# Patient Record
Sex: Female | Born: 1955
Health system: Southern US, Community
[De-identification: ages and names within clinical notes are randomized; demographics above are authoritative.]

## PROBLEM LIST (undated history)

## (undated) DIAGNOSIS — N946 Dysmenorrhea, unspecified: Secondary | ICD-10-CM

## (undated) DIAGNOSIS — N870 Mild cervical dysplasia: Secondary | ICD-10-CM

## (undated) DIAGNOSIS — F419 Anxiety disorder, unspecified: Secondary | ICD-10-CM

## (undated) DIAGNOSIS — G4700 Insomnia, unspecified: Secondary | ICD-10-CM

## (undated) DIAGNOSIS — E785 Hyperlipidemia, unspecified: Secondary | ICD-10-CM

## (undated) DIAGNOSIS — D219 Benign neoplasm of connective and other soft tissue, unspecified: Secondary | ICD-10-CM

## (undated) DIAGNOSIS — Z5189 Encounter for other specified aftercare: Secondary | ICD-10-CM

## (undated) DIAGNOSIS — E7800 Pure hypercholesterolemia, unspecified: Secondary | ICD-10-CM

## (undated) DIAGNOSIS — H269 Unspecified cataract: Secondary | ICD-10-CM

## (undated) HISTORY — DX: Dysmenorrhea, unspecified: N94.6

## (undated) HISTORY — PX: COLPOSCOPY: SHX161

## (undated) HISTORY — DX: Pure hypercholesterolemia, unspecified: E78.00

## (undated) HISTORY — PX: TOE SURGERY: SHX1073

## (undated) HISTORY — DX: Mild cervical dysplasia: N87.0

## (undated) HISTORY — PX: OTHER SURGICAL HISTORY: SHX169

## (undated) HISTORY — DX: Encounter for other specified aftercare: Z51.89

## (undated) HISTORY — PX: GYNECOLOGIC CRYOSURGERY: SHX857

## (undated) HISTORY — DX: Benign neoplasm of connective and other soft tissue, unspecified: D21.9

## (undated) HISTORY — DX: Hyperlipidemia, unspecified: E78.5

## (undated) HISTORY — DX: Anxiety disorder, unspecified: F41.9

## (undated) HISTORY — DX: Insomnia, unspecified: G47.00

## (undated) HISTORY — DX: Unspecified cataract: H26.9

---

## 1997-07-06 HISTORY — PX: ABDOMINAL HYSTERECTOMY: SHX81

## 1999-10-22 ENCOUNTER — Other Ambulatory Visit: Admission: RE | Admit: 1999-10-22 | Discharge: 1999-10-22 | Payer: Self-pay | Admitting: Obstetrics and Gynecology

## 2000-12-02 ENCOUNTER — Encounter: Payer: Self-pay | Admitting: Obstetrics and Gynecology

## 2000-12-02 ENCOUNTER — Ambulatory Visit (HOSPITAL_COMMUNITY): Admission: RE | Admit: 2000-12-02 | Discharge: 2000-12-02 | Payer: Self-pay | Admitting: Obstetrics and Gynecology

## 2001-10-27 ENCOUNTER — Other Ambulatory Visit: Admission: RE | Admit: 2001-10-27 | Discharge: 2001-10-27 | Payer: Self-pay | Admitting: Obstetrics and Gynecology

## 2001-12-06 ENCOUNTER — Ambulatory Visit (HOSPITAL_COMMUNITY): Admission: RE | Admit: 2001-12-06 | Discharge: 2001-12-06 | Payer: Self-pay | Admitting: Obstetrics and Gynecology

## 2001-12-06 ENCOUNTER — Encounter: Payer: Self-pay | Admitting: Obstetrics and Gynecology

## 2002-10-31 ENCOUNTER — Other Ambulatory Visit: Admission: RE | Admit: 2002-10-31 | Discharge: 2002-10-31 | Payer: Self-pay | Admitting: Obstetrics and Gynecology

## 2002-12-08 ENCOUNTER — Encounter: Payer: Self-pay | Admitting: Obstetrics and Gynecology

## 2002-12-08 ENCOUNTER — Ambulatory Visit (HOSPITAL_COMMUNITY): Admission: RE | Admit: 2002-12-08 | Discharge: 2002-12-08 | Payer: Self-pay | Admitting: Obstetrics and Gynecology

## 2003-09-12 ENCOUNTER — Ambulatory Visit (HOSPITAL_COMMUNITY): Admission: RE | Admit: 2003-09-12 | Discharge: 2003-09-12 | Payer: Self-pay | Admitting: Gastroenterology

## 2003-09-12 ENCOUNTER — Encounter (INDEPENDENT_AMBULATORY_CARE_PROVIDER_SITE_OTHER): Payer: Self-pay | Admitting: Specialist

## 2003-11-01 ENCOUNTER — Other Ambulatory Visit: Admission: RE | Admit: 2003-11-01 | Discharge: 2003-11-01 | Payer: Self-pay | Admitting: Obstetrics and Gynecology

## 2003-12-18 ENCOUNTER — Ambulatory Visit (HOSPITAL_COMMUNITY): Admission: RE | Admit: 2003-12-18 | Discharge: 2003-12-18 | Payer: Self-pay | Admitting: Obstetrics and Gynecology

## 2004-11-06 ENCOUNTER — Other Ambulatory Visit: Admission: RE | Admit: 2004-11-06 | Discharge: 2004-11-06 | Payer: Self-pay | Admitting: Obstetrics and Gynecology

## 2005-01-20 ENCOUNTER — Ambulatory Visit (HOSPITAL_COMMUNITY): Admission: RE | Admit: 2005-01-20 | Discharge: 2005-01-20 | Payer: Self-pay | Admitting: Obstetrics and Gynecology

## 2005-11-06 ENCOUNTER — Other Ambulatory Visit: Admission: RE | Admit: 2005-11-06 | Discharge: 2005-11-06 | Payer: Self-pay | Admitting: Obstetrics and Gynecology

## 2006-01-27 ENCOUNTER — Ambulatory Visit (HOSPITAL_COMMUNITY): Admission: RE | Admit: 2006-01-27 | Discharge: 2006-01-27 | Payer: Self-pay | Admitting: Obstetrics and Gynecology

## 2007-02-01 ENCOUNTER — Ambulatory Visit (HOSPITAL_COMMUNITY): Admission: RE | Admit: 2007-02-01 | Discharge: 2007-02-01 | Payer: Self-pay | Admitting: Obstetrics and Gynecology

## 2007-02-02 ENCOUNTER — Other Ambulatory Visit: Admission: RE | Admit: 2007-02-02 | Discharge: 2007-02-02 | Payer: Self-pay | Admitting: Gynecology

## 2008-04-10 ENCOUNTER — Encounter: Payer: Self-pay | Admitting: Obstetrics and Gynecology

## 2008-04-10 ENCOUNTER — Other Ambulatory Visit: Admission: RE | Admit: 2008-04-10 | Discharge: 2008-04-10 | Payer: Self-pay | Admitting: Obstetrics and Gynecology

## 2008-04-10 ENCOUNTER — Ambulatory Visit: Payer: Self-pay | Admitting: Obstetrics and Gynecology

## 2008-04-10 ENCOUNTER — Ambulatory Visit (HOSPITAL_COMMUNITY): Admission: RE | Admit: 2008-04-10 | Discharge: 2008-04-10 | Payer: Self-pay | Admitting: Obstetrics and Gynecology

## 2008-04-18 ENCOUNTER — Encounter: Admission: RE | Admit: 2008-04-18 | Discharge: 2008-04-18 | Payer: Self-pay | Admitting: Obstetrics and Gynecology

## 2009-04-11 ENCOUNTER — Encounter: Admission: RE | Admit: 2009-04-11 | Discharge: 2009-04-11 | Payer: Self-pay | Admitting: Obstetrics and Gynecology

## 2009-04-15 ENCOUNTER — Encounter: Payer: Self-pay | Admitting: Obstetrics and Gynecology

## 2009-04-15 ENCOUNTER — Other Ambulatory Visit: Admission: RE | Admit: 2009-04-15 | Discharge: 2009-04-15 | Payer: Self-pay | Admitting: Obstetrics and Gynecology

## 2009-04-15 ENCOUNTER — Ambulatory Visit: Payer: Self-pay | Admitting: Obstetrics and Gynecology

## 2010-03-13 ENCOUNTER — Encounter: Admission: RE | Admit: 2010-03-13 | Discharge: 2010-03-13 | Payer: Self-pay | Admitting: Otolaryngology

## 2010-04-16 ENCOUNTER — Other Ambulatory Visit: Admission: RE | Admit: 2010-04-16 | Discharge: 2010-04-16 | Payer: Self-pay | Admitting: Obstetrics and Gynecology

## 2010-04-16 ENCOUNTER — Ambulatory Visit: Payer: Self-pay | Admitting: Obstetrics and Gynecology

## 2010-04-30 ENCOUNTER — Encounter: Admission: RE | Admit: 2010-04-30 | Discharge: 2010-04-30 | Payer: Self-pay | Admitting: Obstetrics and Gynecology

## 2010-05-07 ENCOUNTER — Ambulatory Visit: Payer: Self-pay | Admitting: Obstetrics and Gynecology

## 2010-06-06 ENCOUNTER — Ambulatory Visit (HOSPITAL_BASED_OUTPATIENT_CLINIC_OR_DEPARTMENT_OTHER)
Admission: RE | Admit: 2010-06-06 | Discharge: 2010-06-06 | Payer: Self-pay | Source: Home / Self Care | Admitting: Orthopedic Surgery

## 2010-06-24 ENCOUNTER — Ambulatory Visit: Payer: Self-pay | Admitting: Obstetrics and Gynecology

## 2010-09-16 LAB — POCT HEMOGLOBIN-HEMACUE: Hemoglobin: 14.4 g/dL (ref 12.0–15.0)

## 2010-11-21 NOTE — Op Note (Signed)
NAME:  April Wood, April Wood                            ACCOUNT NO.:  [id number]   MEDICAL RECORD NO.:  [id number]                   PATIENT TYPE:  AMB   LOCATION:  ENDO                                 FACILITY:  [facility]   PHYSICIAN:  Bernette Redbird, M.D.                DATE OF BIRTH:  16-Dec-1955   DATE OF PROCEDURE:  09/12/2003  DATE OF DISCHARGE:                                 OPERATIVE REPORT   PROCEDURE:  Colonoscopy with biopsy.   INDICATIONS FOR PROCEDURE:  Screening for colon cancer in a patient with a  family history of colon cancer in a maternal aunt and colon polyps in a  maternal uncle, without worrisome symptoms.   FINDINGS:  Diminutive polyp near the hepatic flexure.  Right side  diverticulosis.   DESCRIPTION OF PROCEDURE:  The nature, purpose and risk of the procedure had  been discussed with the patient who provided written consent.  Sedation was  fentanyl 87.5 mcg and Versed 7 mg IV without arrhythmias or desaturation.   The Olympus adjustable tension pediatric video colonoscope was advanced  around the colon.  I encountered a 2 mm sessile polyp near the hepatic  flexure in the transverse colon, removed by several cold biopsies to the  point of complete excision. The scope was then advanced to the cecum as  identified by clear visualization of the appendiceal orifice and pullback  was initiated. The quality of the prep was excellent. It is felt that all  areas were well seen.   There was some mild right sided diverticulosis.   No other polyps were seen other than the one mentioned above, no large  polyps, no cancer, colitis, vascular malformations or extensive  diverticulosis.   Retroflexion in the rectum was unremarkable.   The patient tolerated the procedure well and there were no apparent  complications.   IMPRESSION:  1. Solitary diminutive sessile polyp removed from the colon as described     above (211.3).  2. Mild right side diverticulosis.  3. Remote family  history of colon cancer.   PLAN:  Await pathology results with anticipated probable colonoscopic  followup in five years in view of the family history.                                               Bernette Redbird, M.D.    RB/MEDQ  D:  09/12/2003  T:  09/12/2003  Job:  [id number]   cc:   Marjory Lies, M.D.  [address]  [place]  [place] [address]  Fax: [phone]

## 2011-05-04 ENCOUNTER — Other Ambulatory Visit: Payer: Self-pay | Admitting: Obstetrics and Gynecology

## 2011-05-04 DIAGNOSIS — Z1231 Encounter for screening mammogram for malignant neoplasm of breast: Secondary | ICD-10-CM

## 2011-05-27 ENCOUNTER — Encounter: Payer: Self-pay | Admitting: Gynecology

## 2011-05-27 DIAGNOSIS — N946 Dysmenorrhea, unspecified: Secondary | ICD-10-CM | POA: Insufficient documentation

## 2011-05-27 DIAGNOSIS — D219 Benign neoplasm of connective and other soft tissue, unspecified: Secondary | ICD-10-CM | POA: Insufficient documentation

## 2011-06-08 ENCOUNTER — Other Ambulatory Visit (HOSPITAL_COMMUNITY)
Admission: RE | Admit: 2011-06-08 | Discharge: 2011-06-08 | Disposition: A | Discharge status: Home / Self Care | Payer: BC Managed Care – PPO | Source: Ambulatory Visit | Attending: Obstetrics and Gynecology | Admitting: Obstetrics and Gynecology

## 2011-06-08 ENCOUNTER — Encounter: Payer: Self-pay | Admitting: Obstetrics and Gynecology

## 2011-06-08 ENCOUNTER — Ambulatory Visit (INDEPENDENT_AMBULATORY_CARE_PROVIDER_SITE_OTHER): Payer: BC Managed Care – PPO | Admitting: Obstetrics and Gynecology

## 2011-06-08 VITALS — BP 132/76 | Ht 63.0 in | Wt 149.0 lb

## 2011-06-08 DIAGNOSIS — R823 Hemoglobinuria: Secondary | ICD-10-CM

## 2011-06-08 DIAGNOSIS — Z01419 Encounter for gynecological examination (general) (routine) without abnormal findings: Secondary | ICD-10-CM

## 2011-06-08 DIAGNOSIS — N870 Mild cervical dysplasia: Secondary | ICD-10-CM | POA: Insufficient documentation

## 2011-06-08 MED ORDER — ESTRADIOL ACETATE 0.05 MG/24HR VA RING: VAGINAL_RING | VAGINAL | Status: DC

## 2011-06-08 NOTE — Progress Notes (Signed)
Patient came to see me today for an annual GYN exam. She remains on Femring which is very happy with. She is having no vaginal bleeding or pelvic pain. She is due for her mammogram and has made an appointment. She does her lab through her PCP. She said to bone densities which were basically normal.  HEENT: Within normal limits.   Kennon Portela present Neck: No masses. Supraclavicular lymph nodes: Not enlarged. Breasts: Examined in both sitting and lying position. Symmetrical without skin changes or masses. Abdomen: Soft no masses guarding or rebound. No hernias. Pelvic: External within normal limits. BUS within normal limits. Vaginal examination shows good estrogen effect, no cystocele enterocele or rectocele. Cervix and uterus absent. Adnexa within normal limits. Rectovaginal confirmatory. Extremities within normal limits.  Assessment: Menopausal symptoms  Plan: Continue Femring. Mammogram.

## 2011-06-08 NOTE — Progress Notes (Signed)
Addended byCammie Mcgee T on: 06/08/2011 02:31 PM   Modules accepted: Orders

## 2011-06-10 ENCOUNTER — Ambulatory Visit
Admission: RE | Admit: 2011-06-10 | Discharge: 2011-06-10 | Disposition: A | Discharge status: Home / Self Care | Payer: BC Managed Care – PPO | Source: Ambulatory Visit | Attending: Obstetrics and Gynecology | Admitting: Obstetrics and Gynecology

## 2011-06-10 DIAGNOSIS — Z1231 Encounter for screening mammogram for malignant neoplasm of breast: Secondary | ICD-10-CM

## 2011-06-15 ENCOUNTER — Other Ambulatory Visit: Payer: Self-pay | Admitting: *Deleted

## 2011-06-15 ENCOUNTER — Other Ambulatory Visit: Payer: Self-pay | Admitting: Obstetrics and Gynecology

## 2011-06-15 DIAGNOSIS — N63 Unspecified lump in unspecified breast: Secondary | ICD-10-CM

## 2011-07-01 ENCOUNTER — Ambulatory Visit
Admission: RE | Admit: 2011-07-01 | Discharge: 2011-07-01 | Disposition: A | Discharge status: Home / Self Care | Payer: BC Managed Care – PPO | Source: Ambulatory Visit | Attending: Obstetrics and Gynecology | Admitting: Obstetrics and Gynecology

## 2011-07-01 DIAGNOSIS — N63 Unspecified lump in unspecified breast: Secondary | ICD-10-CM

## 2012-04-28 ENCOUNTER — Other Ambulatory Visit: Payer: Self-pay | Admitting: Obstetrics and Gynecology

## 2012-04-28 DIAGNOSIS — Z1231 Encounter for screening mammogram for malignant neoplasm of breast: Secondary | ICD-10-CM

## 2012-06-13 ENCOUNTER — Ambulatory Visit
Admission: RE | Admit: 2012-06-13 | Discharge: 2012-06-13 | Disposition: A | Discharge status: Home / Self Care | Payer: BC Managed Care – PPO | Source: Ambulatory Visit | Attending: Obstetrics and Gynecology | Admitting: Obstetrics and Gynecology

## 2012-06-13 ENCOUNTER — Ambulatory Visit (INDEPENDENT_AMBULATORY_CARE_PROVIDER_SITE_OTHER): Payer: BC Managed Care – PPO | Admitting: Obstetrics and Gynecology

## 2012-06-13 ENCOUNTER — Encounter: Payer: Self-pay | Admitting: Obstetrics and Gynecology

## 2012-06-13 VITALS — BP 120/74 | Ht 63.0 in | Wt 136.0 lb

## 2012-06-13 DIAGNOSIS — Z01419 Encounter for gynecological examination (general) (routine) without abnormal findings: Secondary | ICD-10-CM

## 2012-06-13 DIAGNOSIS — Z1231 Encounter for screening mammogram for malignant neoplasm of breast: Secondary | ICD-10-CM

## 2012-06-13 MED ORDER — ESTRADIOL ACETATE 0.05 MG/24HR VA RING: VAGINAL_RING | VAGINAL | Status: DC

## 2012-06-13 NOTE — Patient Instructions (Signed)
See if anyone in the family has been screened for BRCA1 and BRCA2. After this Genetic counseling at [facility].

## 2012-06-13 NOTE — Progress Notes (Signed)
Patient came to see me today for her annual GYN exam. She remains on Femring with excellent results. She has had several normal bone densities except for borderline osteopenia with a T score of -1.1. Her last bone density was 2011. She has had no fractures. She is having no vaginal bleeding. She is having no pelvic pain. She had a total abdominal hysterectomy in 1999 for symptomatic fibroids. Prior to 1994 she had cryosurgery for CIN. [place] was [place] the [place]. She has had normal Pap smears since then. Her last Pap smear was 2012. She is up-to-date on mammograms She is having 1 today. She does her lab through her PCP. Her paternal uncles' daughter had early onset breast cancer in her 30s and is deceased. She is the only family member with early onset breast cancer or ovarian cancer. The patient's ethnic background  Is [place].  HEENT: Within normal limits.Kennon Portela present. Neck: No masses. Supraclavicular lymph nodes: Not enlarged. Breasts: Examined in both sitting and lying position. Symmetrical without skin changes or masses. Abdomen: Soft no masses guarding or rebound. No hernias. Pelvic: External within normal limits. BUS within normal limits. Vaginal examination shows good estrogen effect, no cystocele enterocele or rectocele. Cervix and uterus absent. Adnexa within normal limits. Rectovaginal confirmatory. Extremities within normal limits.  Assessment: #1. Menopausal symptoms #2.borderline osteopenia. #3. One family member with early onset breast cancer. #4. CIN  Plan: Continue Femring. Continue yearly mammograms. Continue periodic bone densities. Pap not done.The new Pap smear guidelines were discussed with the patient. Patient to check with family to see if any 1 has had BRCA1 and BRCA2 testing. After this she will get genetic counseling.

## 2012-06-23 ENCOUNTER — Encounter: Payer: Self-pay | Admitting: Obstetrics and Gynecology

## 2012-11-01 ENCOUNTER — Ambulatory Visit (INDEPENDENT_AMBULATORY_CARE_PROVIDER_SITE_OTHER): Payer: BC Managed Care – PPO | Admitting: Gynecology

## 2012-11-01 ENCOUNTER — Encounter: Payer: Self-pay | Admitting: Gynecology

## 2012-11-01 DIAGNOSIS — N898 Other specified noninflammatory disorders of vagina: Secondary | ICD-10-CM

## 2012-11-01 DIAGNOSIS — N899 Noninflammatory disorder of vagina, unspecified: Secondary | ICD-10-CM

## 2012-11-01 DIAGNOSIS — R102 Pelvic and perineal pain: Secondary | ICD-10-CM

## 2012-11-01 DIAGNOSIS — B3749 Other urogenital candidiasis: Secondary | ICD-10-CM

## 2012-11-01 DIAGNOSIS — N949 Unspecified condition associated with female genital organs and menstrual cycle: Secondary | ICD-10-CM

## 2012-11-01 LAB — URINALYSIS W MICROSCOPIC + REFLEX CULTURE
Bilirubin Urine: NEGATIVE
Casts: NONE SEEN
Crystals: NONE SEEN
Glucose, UA: NEGATIVE mg/dL
Ketones, ur: NEGATIVE mg/dL
Nitrite: NEGATIVE
Protein, ur: NEGATIVE mg/dL
Specific Gravity, Urine: 1.01 (ref 1.005–1.030)
Urobilinogen, UA: 0.2 mg/dL (ref 0.0–1.0)
pH: 7.5 (ref 5.0–8.0)

## 2012-11-01 LAB — WET PREP FOR TRICH, YEAST, CLUE: Clue Cells Wet Prep HPF POC: NONE SEEN

## 2012-11-01 MED ORDER — FLUCONAZOLE 200 MG PO TABS: 200.0000 mg | ORAL_TABLET | Freq: Every day | ORAL | Status: DC

## 2012-11-01 NOTE — Progress Notes (Signed)
Patient presents with several days' complaint of vaginal eructation and vaginal discomfort. She feels a throbbing type of pain intravaginally. Had some dysuria. No low back pain fever chills or other constitutional symptoms. Uses Femring and is status post hysterectomy.  Exam with Kim Asst. Pelvic external BUS vagina with slight white discharge. Premarin in place. Bimanual without masses or significant tenderness.  Assessment and plan: Wet prep and urinalysis both return with yeast. Will treat with Diflucan 200 mg daily x5 days. We'll also culture urine for bacterial growth. Followup if symptoms persist, worsen or recur.

## 2012-11-01 NOTE — Patient Instructions (Signed)
Take Diflucan pill daily for 5 days. Followup if symptoms persist, worsen or recur.

## 2012-11-04 ENCOUNTER — Telehealth: Payer: Self-pay | Admitting: *Deleted

## 2012-11-04 NOTE — Telephone Encounter (Signed)
Pt calling to follow up with OV on 11/01/12 she has took 3 of the 5 pill of diflucan and still no relief. Still having throbbing pain, no discharge, pt is willing to take the remaining diflucan but concerned about the weekend and the throbbing pain still there. Please advise

## 2012-11-04 NOTE — Telephone Encounter (Signed)
I would continue the Diflucan for now. If her discomfort continues through the weekend I would recommend she call and we'll schedule her for an ultrasound just to make sure there's nothing else going on within the pelvis. Sometimes it takes several days for Diflucan to kick in to take care of yeast.

## 2012-11-04 NOTE — Telephone Encounter (Signed)
Left message for pt to call.

## 2012-11-04 NOTE — Telephone Encounter (Signed)
Pt informed with the below note and will call back if problems continue.

## 2012-11-29 ENCOUNTER — Telehealth: Payer: Self-pay | Admitting: *Deleted

## 2012-11-29 NOTE — Telephone Encounter (Signed)
Recommend schedule GYN ultrasound and followup office visit.

## 2012-11-29 NOTE — Telephone Encounter (Signed)
Pt called saw you end of April for a problem she is still having. Its all vaginal/perineum pain, between vagina and rectum. Throbbing, burning, stabbing pain, its discomfort and pressure with sitting. You had mentioned in a previous telephone encounter to maybe do an [place] to R/O pelvic origin. Do you want that scheduled? She wants an OV to discuss what to do about this pain. PLs advise

## 2012-11-29 NOTE — Telephone Encounter (Signed)
PT will be called to schedule. Pt transferred to apts to schedule. KW

## 2012-11-30 ENCOUNTER — Ambulatory Visit: Payer: BC Managed Care – PPO | Admitting: Gynecology

## 2012-12-01 ENCOUNTER — Ambulatory Visit (INDEPENDENT_AMBULATORY_CARE_PROVIDER_SITE_OTHER): Payer: BC Managed Care – PPO

## 2012-12-01 ENCOUNTER — Other Ambulatory Visit: Payer: Self-pay | Admitting: Gynecology

## 2012-12-01 ENCOUNTER — Encounter: Payer: Self-pay | Admitting: Gynecology

## 2012-12-01 ENCOUNTER — Ambulatory Visit (INDEPENDENT_AMBULATORY_CARE_PROVIDER_SITE_OTHER): Payer: BC Managed Care – PPO | Admitting: Gynecology

## 2012-12-01 DIAGNOSIS — R1030 Lower abdominal pain, unspecified: Secondary | ICD-10-CM

## 2012-12-01 DIAGNOSIS — N949 Unspecified condition associated with female genital organs and menstrual cycle: Secondary | ICD-10-CM

## 2012-12-01 DIAGNOSIS — R102 Pelvic and perineal pain: Secondary | ICD-10-CM

## 2012-12-01 DIAGNOSIS — K6289 Other specified diseases of anus and rectum: Secondary | ICD-10-CM

## 2012-12-01 DIAGNOSIS — R109 Unspecified abdominal pain: Secondary | ICD-10-CM

## 2012-12-01 DIAGNOSIS — N83339 Acquired atrophy of ovary and fallopian tube, unspecified side: Secondary | ICD-10-CM

## 2012-12-01 DIAGNOSIS — R350 Frequency of micturition: Secondary | ICD-10-CM

## 2012-12-01 MED ORDER — ESTRADIOL 0.075 MG/24HR TD PTTW: 1.0000 | MEDICATED_PATCH | TRANSDERMAL | Status: DC

## 2012-12-01 MED ORDER — IBUPROFEN 800 MG PO TABS: 800.0000 mg | ORAL_TABLET | Freq: Three times a day (TID) | ORAL | Status: AC | PRN

## 2012-12-01 NOTE — Progress Notes (Signed)
Patient presents complaining of continued vaginal and vulvar discomfort. Was evaluated end of April and treated for a yeast vulvovaginitis. States though over the last month or so a lot of vaginal pain that makes it difficult to sit down. No discharge itching. Also notes some pain on the outer labia area as a throbbing aching sensation. Does have urinary frequency but no dysuria urgency symptoms. Follow for diverticulitis. Having regular bowel movements no diarrhea constipation. Uses Femring for ERT. She called with these complaints and I ordered an ultrasound with recommendation for followup exam she presents with her husband now.  Ultrasound is normal showing both ovaries visualized and postmenopausal. No masses or other pathology visualized.   Exam with Selena Batten assistant External BUS vagina is normal. She does have some vaginal mucosa redundancy particularly posteriorly with mild rectocele. Bimanual without masses or tenderness. Rectovaginal exam is normal.  Assessment and plan: Unusual symptoms. No gross evidence of pathology on exam or ultrasound. Urinalysis end of April normal period will repeat now for completeness. Recommend stop using Femring as this may be continuing to her discomfort on some level. Gave her samples of Vivelle 0.1 mg patches x2 weeks with prescription sent for .075 as we did not have samples of these. Will see how she does after stopping the Femring [place] if this is not related. If her symptoms continue I may consider sending to urology. I discussed possibilities to include interstitial cystitis. If her symptoms totally clear then she'll stay on the patches and followup in the fall when she is due for her annual. I reviewed the issues of ERT with her and her husband to include the increased risk of stroke heart attack DVT and breast cancer issues. Benefits of transdermal over oral were also reviewed.

## 2012-12-01 NOTE — Patient Instructions (Signed)
Start on the estrogen patches as we discussed. Call me if your pain continues and we may refer you to urology.

## 2012-12-02 LAB — URINALYSIS W MICROSCOPIC + REFLEX CULTURE
Bacteria, UA: NONE SEEN
Bilirubin Urine: NEGATIVE
Casts: NONE SEEN
Crystals: NONE SEEN
Glucose, UA: NEGATIVE mg/dL
Hgb urine dipstick: NEGATIVE
Ketones, ur: NEGATIVE mg/dL
Leukocytes, UA: NEGATIVE
Nitrite: NEGATIVE
Protein, ur: NEGATIVE mg/dL
Specific Gravity, Urine: 1.009 (ref 1.005–1.030)
Squamous Epithelial / HPF: NONE SEEN
Urobilinogen, UA: 0.2 mg/dL (ref 0.0–1.0)
pH: 7.5 (ref 5.0–8.0)

## 2012-12-14 ENCOUNTER — Telehealth (INDEPENDENT_AMBULATORY_CARE_PROVIDER_SITE_OTHER): Payer: Self-pay

## 2012-12-14 NOTE — Telephone Encounter (Signed)
I called and left the pt a message to call me.  I am able to work her in Friday.  I want to give her the time.

## 2012-12-14 NOTE — Telephone Encounter (Signed)
The patient called to request an appointment.  She has hemorrhoids and is having a lot of pain, no bleeding.  She has perineal pain on the left side and throbbing pain when sleeps.  She is not able to move her bowels normally.  She is on a stool softener and tried a suppository.  She is drinking water.  She has altered her diet and is trying to increase fruit.  Pt thinks she is impacted.  I told her she may need to only stay on liquids until she has a bm.  She asked if she can be seen sooner.  I will send a message to Dr Maisie Fus to see if we can squeeze her in. I also made sure she is doing warm tub soaks and she is.

## 2012-12-15 ENCOUNTER — Telehealth (INDEPENDENT_AMBULATORY_CARE_PROVIDER_SITE_OTHER): Payer: Self-pay

## 2012-12-15 NOTE — Telephone Encounter (Signed)
Patient returned call. Huntley Dec not available left  message for Huntley Dec to call patient

## 2012-12-15 NOTE — Telephone Encounter (Signed)
I called and gave the pt her appointment for Friday.

## 2012-12-16 ENCOUNTER — Encounter (INDEPENDENT_AMBULATORY_CARE_PROVIDER_SITE_OTHER): Payer: Self-pay | Admitting: General Surgery

## 2012-12-16 ENCOUNTER — Ambulatory Visit (INDEPENDENT_AMBULATORY_CARE_PROVIDER_SITE_OTHER): Payer: BC Managed Care – PPO | Admitting: General Surgery

## 2012-12-16 VITALS — BP 118/64 | HR 82 | Temp 97.3°F | Resp 16 | Ht 63.0 in | Wt 131.0 lb

## 2012-12-16 DIAGNOSIS — K644 Residual hemorrhoidal skin tags: Secondary | ICD-10-CM

## 2012-12-16 DIAGNOSIS — M25559 Pain in unspecified hip: Secondary | ICD-10-CM

## 2012-12-16 DIAGNOSIS — M25552 Pain in left hip: Secondary | ICD-10-CM

## 2012-12-16 MED ORDER — HYDROCORTISONE 2.5 % RE CREA: TOPICAL_CREAM | Freq: Two times a day (BID) | RECTAL | Status: DC

## 2012-12-16 NOTE — Progress Notes (Signed)
Chief Complaint  Patient presents with  . New Evaluation    eval hems    HISTORY: April Wood is a 57 y.o. female who presents to the office with rectal pain.  Other symptoms include burning radiating to her L groin and constipation.  Burning pain for 6 weeks.  Recent h/o constipation.    She has tried sitz baths in the past with some success.  Nothing makes the symptoms worse.   It is continuous in nature.  Her bowel habits are regular but her bowel movements are hard.  Her fiber intake is moderate.    She does have prolapsing tissue.   She has had some worse constipation over the past week.  Past Medical History  Diagnosis Date  . Fibroid   . Dysmenorrhea   . Elevated cholesterol   . CIN I (cervical intraepithelial neoplasia I)   . Blood transfusion without reported diagnosis       Past Surgical History  Procedure Laterality Date  . Abdominal hysterectomy  1999    TAH  . Thumb surg    . Gynecologic cryosurgery    . Colposcopy    . Toe surgery      Right        Current Outpatient Prescriptions  Medication Sig Dispense Refill  . acyclovir (ZOVIRAX) 800 MG tablet       . Calcium Carbonate-Vitamin D (CALCIUM + D PO) Take by mouth.        . zolpidem (AMBIEN) 5 MG tablet Take 5 mg by mouth at bedtime as needed.        Marland Kitchen estradiol (MINIVELLE) 0.075 MG/24HR Place 1 patch onto the skin 2 (two) times a week.  8 patch  12  . Estradiol Acetate (FEMRING) 0.05 MG/24HR RING Change ring every 90 days  1 each  4  . hydrocortisone (ANUSOL-HC) 2.5 % rectal cream Place rectally 2 (two) times daily. Apply around anus for irritated & painful hemorrhoids  15 g  2  . ibuprofen (ADVIL,MOTRIN) 800 MG tablet Take 1 tablet (800 mg total) by mouth every 8 (eight) hours as needed for pain.  30 tablet  1   No current facility-administered medications for this visit.      No Known Allergies    Family History  Problem Relation Age of Onset  . Heart disease Father   . Lung cancer Father   . Heart  disease Brother   . Colon cancer Maternal Aunt   . Breast cancer Cousin     Pat. 1st cousin-Age 38    History   Social History  . Marital Status: Married    Spouse Name: N/A    Number of Children: N/A  . Years of Education: N/A   Social History Main Topics  . Smoking status: Never Smoker   . Smokeless tobacco: Never Used  . Alcohol Use: No     Comment: rare  . Drug Use: No  . Sexually Active: Yes    Birth Control/ Protection: Surgical   Other Topics Concern  . None   Social History Narrative  . None      REVIEW OF SYSTEMS - PERTINENT POSITIVES ONLY: Review of Systems - General ROS: negative for - chills, fever or weight loss Hematological and Lymphatic ROS: negative for - bleeding problems, blood clots or bruising Respiratory ROS: no cough, shortness of breath, or wheezing Cardiovascular ROS: no chest pain or dyspnea on exertion Gastrointestinal ROS: positive for - constipation negative for - abdominal pain,  blood in stools or diarrhea Genito-Urinary ROS: no dysuria, trouble voiding, or hematuria  EXAM: Filed Vitals:   12/16/12 1449  BP: 118/64  Pulse: 82  Temp: 97.3 F (36.3 C)  Resp: 16    General appearance: alert and cooperative Resp: clear to auscultation bilaterally Cardio: regular rate and rhythm GI: soft, non-tender; bowel sounds normal; no masses,  no organomegaly   Procedure: Anoscopy Surgeon: Maisie Fus Diagnosis: anal pain  Assistant: Christella Scheuermann After the risks and benefits were explained, verbal consent was obtained for above procedure  Anesthesia: none Findings: anterior bulge with possible fluctuance, anterior skin tag with prolapse of hemorrhoid tissue,  Grade 1 internal hemorrhoids  Procedure: Anterior mass aspirated under local anesthesia with no return of fluid.  ASSESSMENT AND PLAN: April Wood is a 58 y.o. F with pelvic pain.  She does have an external skin tag that may account for her rectal burning.  I do not see any anorectal reasons  for her groin pain.  I have prescribed her anusol cream to help with the rectal burning.  She is already using sitz baths.  I have given her instructions for help with her constipation.  She will return if her symptoms persist using these treatments.  She has TTP on the L internal iliac region.  I think her pain there may be musculoskeletal in nature.      Vanita Panda, MD Colon and Rectal Surgery / General Surgery [facility]      Visit Diagnoses: 1. Pain in joint, pelvic region and thigh, left   2. External hemorrhoid     Primary Care Physician: Georgann Housekeeper, MD

## 2012-12-16 NOTE — Patient Instructions (Signed)
GETTING TO GOOD BOWEL HEALTH. Irregular bowel habits such as constipation can lead to many problems over time.  Having one soft bowel movement a day is the most important way to prevent further problems.  The anorectal canal is designed to handle stretching and feces to safely manage our ability to get rid of solid waste (feces, poop, stool) out of our body.  BUT, hard constipated stools can act like ripping concrete bricks causing inflamed hemorrhoids, anal fissures, abdominal pain and bloating.     The goal: ONE SOFT BOWEL MOVEMENT A DAY!  To have soft, regular bowel movements:    Drink at least 8 tall glasses of water a day.     Take plenty of fiber.  Fiber is the undigested part of plant food that passes into the colon, acting s "natures broom" to encourage bowel motility and movement.  Fiber can absorb and hold large amounts of water. This results in a larger, bulkier stool, which is soft and easier to pass. Work gradually over several weeks up to 6 servings a day of fiber (25g a day even more if needed) in the form of: o Vegetables -- Root (potatoes, carrots, turnips), leafy green (lettuce, salad greens, celery, spinach), or cooked high residue (cabbage, broccoli, etc) o Fruit -- Fresh (unpeeled skin & pulp), Dried (prunes, apricots, cherries, etc ),  or stewed ( applesauce)  o Whole grain breads, pasta, etc (whole wheat)  o Bran cereals    Bulking Agents -- This type of water-retaining fiber generally is easily obtained each day by one of the following:  o Psyllium bran -- The psyllium plant is remarkable because its ground seeds can retain so much water. This product is available as Metamucil, Konsyl, Effersyllium, Per Diem Fiber, or the less expensive generic preparation in drug and health food stores. Although labeled a laxative, it really is not a laxative.  o Methylcellulose -- This is another fiber derived from wood which also retains water. It is available as Citrucel. o Polyethylene Glycol  - and "artificial" fiber commonly called Miralax or Glycolax.  It is helpful for people with gassy or bloated feelings with regular fiber o Flax Seed - a less gassy fiber than psyllium   No reading or other relaxing activity while on the toilet. If bowel movements take longer than 5 minutes, you are too constipated   AVOID CONSTIPATION.  High fiber and water intake usually takes care of this.  Sometimes a laxative is needed to stimulate more frequent bowel movements, but    Laxatives are not a good long-term solution as it can wear the colon out. o Osmotics (Milk of Magnesia, Fleets phosphosoda, Magnesium citrate, MiraLax, GoLytely) are safer than  o Stimulants (Senokot, Castor Oil, Dulcolax, Ex Lax)    o Do not take laxatives for more than 7days in a row.    IF SEVERELY CONSTIPATED, try a Bowel Retraining Program: o Do not use laxatives.  o Eat a diet high in roughage, such as bran cereals and leafy vegetables.  o Drink six (6) ounces of prune or apricot juice each morning.  o Eat two (2) large servings of stewed fruit each day.  o Take one (1) heaping tablespoon of a psyllium-based bulking agent twice a day. Use sugar-free sweetener when possible to avoid excessive calories.  o Eat a normal breakfast.  o Set aside 15 minutes after breakfast to sit on the toilet, but do not strain to have a bowel movement.  o If you do   not have a bowel movement by the third day, use an enema and repeat the above steps.     HEMORRHOIDS    Did you know... Hemorrhoids are one of the most common ailments known.  More than half the population will develop hemorrhoids, usually after age 30.  Millions of Americans currently suffer from hemorrhoids.  The average person suffers in silence for a long period before seeking medical care.  Today's treatment methods make some types of hemorrhoid removal much less painful.  What are hemorrhoids? Often described as "varicose veins of the anus and rectum", hemorrhoids  are enlarged, bulging blood vessels in and about the anus and lower rectum. There are two types of hemorrhoids: external and internal, which refer to their location.  External (outside) hemorrhoids develop near the anus and are covered by very sensitive skin. These are usually painless. However, if a blood clot (thrombosis) develops in an external hemorrhoid, it becomes a painful, hard lump. The external hemorrhoid may bleed if it ruptures. Internal (inside) hemorrhoids develop within the anus beneath the lining. Painless bleeding and protrusion during bowel movements are the most common symptom. However, an internal hemorrhoid can cause severe pain if it is completely "prolapsed" - protrudes from the anal opening and cannot be pushed back inside.   What causes hemorrhoids? An exact cause is unknown; however, the upright posture of humans alone forces a great deal of pressure on the rectal veins, which sometimes causes them to bulge. Other contributing factors include:  . Aging  . Chronic constipation or diarrhea  . Pregnancy  . Heredity  . Straining during bowel movements  . Faulty bowel function due to overuse of laxatives or enemas . Spending long periods of time (e.g., reading) on the toilet  Whatever the cause, the tissues supporting the vessels stretch. As a result, the vessels dilate; their walls become thin and bleed. If the stretching and pressure continue, the weakened vessels protrude.  What are the symptoms? If you notice any of the following, you could have hemorrhoids:  . Bleeding during bowel movements  . Protrusion during bowel movements . Itching in the anal area  . Pain  . Sensitive lump(s)  How are hemorrhoids treated? Mild symptoms can be relieved frequently by increasing the amount of fiber (e.g., fruits, vegetables, breads and cereals) and fluids in the diet. Eliminating excessive straining reduces the pressure on hemorrhoids and helps prevent them from protruding. A  sitz bath - sitting in plain warm water for about 10 minutes - can also provide some relief . With these measures, the pain and swelling of most symptomatic hemorrhoids will decrease in two to seven days, and the firm lump should recede within four to six weeks. In cases of severe or persistent pain from a thrombosed hemorrhoid, your physician may elect to remove the hemorrhoid containing the clot with a small incision. Performed under local anesthesia as an outpatient, this procedure generally provides relief. Severe hemorrhoids may require special treatment, much of which can be performed on an outpatient basis.  . Ligation - the rubber band treatment - works effectively on internal hemorrhoids that protrude with bowel movements. A small rubber band is placed over the hemorrhoid, cutting off its blood supply. The hemorrhoid and the band fall off in a few days and the wound usually heals in a week or two. This procedure sometimes produces mild discomfort and bleeding and may need to be repeated for a full effect.  There is a more intense version of   this procedure that is done in the OR as outpatient surgery called THD.  It involves identifying blood vessels leading to the hemorrhoids and then tying them off with sutures.  This method is a little more painful than rubber band ligation but less painful than traditional hemorrhoidectomy and usually does not have to be repeated.  It is best for internal hemorrhoids that bleed.  Rubber Band Ligation of Internal Hemorrhoids:  A.  Bulging, bleeding, internal hemorrhoid B.  Rubber band applied at the base of the hemorrhoid C.  About 7 days later, the banded hemorrhoid has fallen off leaving a small scar (arrow)  . Injection and Coagulation can also be used on bleeding hemorrhoids that do not protrude. Both methods are relatively painless and cause the hemorrhoid to shrivel up. . Hemorrhoidectomy - surgery to remove the hemorrhoids - is the most complete method  for removal of internal and external hemorrhoids. It is necessary when (1) clots repeatedly form in external hemorrhoids; (2) ligation fails to treat internal hemorrhoids; (3) the protruding hemorrhoid cannot be reduced; or (4) there is persistent bleeding. A hemorrhoidectomy removes excessive tissue that causes the bleeding and protrusion. It is done under anesthesia using sutures, and may, depending upon circumstances, require hospitalization and a period of inactivity. Laser hemorrhoidectomies do not offer any advantage over standard operative techniques. They are also quite expensive, and contrary to popular belief, are no less painful.  Do hemorrhoids lead to cancer? No. There is no relationship between hemorrhoids and cancer. However, the symptoms of hemorrhoids, particularly bleeding, are similar to those of colorectal cancer and other diseases of the digestive system. Therefore, it is important that all symptoms are investigated by a physician specially trained in treating diseases of the colon and rectum and that everyone 50 years or older undergo screening tests for colorectal cancer. Do not rely on over-the-counter medications or other self-treatments. See a colorectal surgeon first so your symptoms can be properly evaluated and effective treatment prescribed.  2012 [employer] Society of Colon [employer] Rectal [employer]     

## 2012-12-16 NOTE — Telephone Encounter (Signed)
error 

## 2013-01-02 ENCOUNTER — Ambulatory Visit (INDEPENDENT_AMBULATORY_CARE_PROVIDER_SITE_OTHER): Payer: Self-pay | Admitting: General Surgery

## 2013-01-23 ENCOUNTER — Encounter: Payer: Self-pay | Admitting: Neurology

## 2013-01-23 ENCOUNTER — Ambulatory Visit (INDEPENDENT_AMBULATORY_CARE_PROVIDER_SITE_OTHER): Payer: BC Managed Care – PPO | Admitting: Neurology

## 2013-01-23 ENCOUNTER — Telehealth: Payer: Self-pay | Admitting: Neurology

## 2013-01-23 VITALS — BP 99/67 | HR 70 | Ht 62.0 in | Wt 132.0 lb

## 2013-01-23 DIAGNOSIS — M25551 Pain in right hip: Secondary | ICD-10-CM

## 2013-01-23 DIAGNOSIS — F411 Generalized anxiety disorder: Secondary | ICD-10-CM

## 2013-01-23 DIAGNOSIS — D259 Leiomyoma of uterus, unspecified: Secondary | ICD-10-CM

## 2013-01-23 DIAGNOSIS — G4700 Insomnia, unspecified: Secondary | ICD-10-CM | POA: Insufficient documentation

## 2013-01-23 DIAGNOSIS — F419 Anxiety disorder, unspecified: Secondary | ICD-10-CM

## 2013-01-23 DIAGNOSIS — D219 Benign neoplasm of connective and other soft tissue, unspecified: Secondary | ICD-10-CM

## 2013-01-23 DIAGNOSIS — N946 Dysmenorrhea, unspecified: Secondary | ICD-10-CM

## 2013-01-23 DIAGNOSIS — E785 Hyperlipidemia, unspecified: Secondary | ICD-10-CM

## 2013-01-23 DIAGNOSIS — M25559 Pain in unspecified hip: Secondary | ICD-10-CM

## 2013-01-23 DIAGNOSIS — Z5189 Encounter for other specified aftercare: Secondary | ICD-10-CM | POA: Insufficient documentation

## 2013-01-23 NOTE — Progress Notes (Signed)
[facility]  PATIENT: April Wood DOB: [birth year]  HISTORICAL Ahnyla is a 57 years old right-handed Caucasian female, referred by her primary care physician Dr. Matthias Hughs, Molly Maduro for evaluation of pelvic area discomfort.  She had past medical history of hysterectomy in 1999, right toe fusion in 2011, since April 2014, without clear trigger he then, she noticed left groin area burning pain, also spreading to rectal, vaginal area, she was evaluated by gynecologist, there was no abnormality found, she was later also referred for colonoscopy, there was no significant abnormality, in June fourth, she had few episode of constipation, she felt the stool seems to come down to the rectal area, but simply would not come out, it was relieved by enema. She had a recurrent constipation few days later, she is not taking MiraLax, doing very well, she denies bowel and bladder incontinence, she was also referred to urology clinic for pelvic physical therapy, there was mild improvement of her symptoms.  She denies gait difficulty, no bilateral lower extremity weakness, or numbness, but she has left groin area burning discomfort,   she has to sit down her right buttock region to relieve the  pressure from her left leg,  she has tried Lyrica in the past, could not tolerate the side effects, due to dizziness.  She was seen by Dr. love in 2006 for gum and facial pain, was given prescription of neuronton, which has helped her symptoms.      REVIEW OF SYSTEMS: Full 14 system review of systems performed and notable only for left groin pain,   ALLERGIES: No Known Allergies  HOME MEDICATIONS: Outpatient Prescriptions Prior to Visit  Medication Sig Dispense Refill  . acyclovir (ZOVIRAX) 800 MG tablet       . Calcium Carbonate-Vitamin D (CALCIUM + D PO) Take by mouth.        . estradiol (MINIVELLE) 0.075 MG/24HR Place 1 patch onto the skin 2 (two) times a week.  8 patch  12  . Estradiol Acetate  (FEMRING) 0.05 MG/24HR RING Change ring every 90 days  1 each  4  . hydrocortisone (ANUSOL-HC) 2.5 % rectal cream Place rectally 2 (two) times daily. Apply around anus for irritated & painful hemorrhoids  15 g  2  . ibuprofen (ADVIL,MOTRIN) 800 MG tablet Take 1 tablet (800 mg total) by mouth every 8 (eight) hours as needed for pain.  30 tablet  1  . zolpidem (AMBIEN) 5 MG tablet Take 5 mg by mouth at bedtime as needed.         No facility-administered medications prior to visit.    PAST MEDICAL HISTORY: Past Medical History  Diagnosis Date  . Fibroid   . Dysmenorrhea   . Elevated cholesterol   . CIN I (cervical intraepithelial neoplasia I)   . Blood transfusion without reported diagnosis   . Hyperlipemia   . Insomnia   . Anxiety     PAST SURGICAL HISTORY: Past Surgical History  Procedure Laterality Date  . Abdominal hysterectomy  1999    TAH  . Thumb surg    . Gynecologic cryosurgery    . Colposcopy    . Toe surgery      Right    FAMILY HISTORY: Family History  Problem Relation Age of Onset  . Heart disease Father   . Lung cancer Father   . Heart disease Brother   . Colon cancer Maternal Aunt   . Breast cancer Cousin     Pat. 1st cousin-Age  30    SOCIAL HISTORY:  History   Social History  . Marital Status: Married    Spouse Name: N/A    Number of Children: N/A  . Years of Education: N/A   Occupational History  . Not on file.   Social History Main Topics  . Smoking status: Never Smoker   . Smokeless tobacco: Never Used  . Alcohol Use: 0.6 oz/week    1 Glasses of wine per week     Comment: rare  . Drug Use: No  . Sexually Active: Yes    Birth Control/ Protection: Surgical   Other Topics Concern  . Not on file   Social History Narrative  . No narrative on file     PHYSICAL EXAM   01/23/13 1012  BP: 99/67  Pulse: 70  Height: 5\' 2"  (1.575 m)  Weight: 132 lb (59.875 kg)    Not recorded    Body mass index is 24.14  kg/(m^2).   Generalized: In no acute distress  Neck: Supple, no carotid bruits   Cardiac: Regular rate rhythm  Pulmonary: Clear to auscultation bilaterally  Musculoskeletal: No deformity  Neurological examination  Mentation: Alert oriented to time, place, history taking, and causual conversation  Cranial nerve II-XII: Pupils were equal round reactive to light extraocular movements were full, visual field were full on confrontational test. facial sensation and strength were normal. hearing was intact to finger rubbing bilaterally. Uvula tongue midline.  head turning and shoulder shrug and were normal and symmetric.Tongue protrusion into cheek strength was normal.  Motor: normal tone, bulk and strength.  Sensory: Intact to fine touch, pinprick, preserved vibratory sensation, and proprioception at toes.  Coordination: Normal finger to nose, heel-to-shin bilaterally there was no truncal ataxia  Gait: Rising up from seated position without assistance, normal stance, without trunk ataxia, moderate stride, good arm swing, smooth turning, able to perform tiptoe, and heel walking without difficulty.   Romberg signs: Negative  Deep tendon reflexes: Brachioradialis 2/2, biceps 2/2, triceps 2/2, patellar 2/2, Achilles 2/2, plantar responses were flexor bilaterally.   DIAGNOSTIC DATA (LABS, IMAGING, TESTING) - I reviewed patient records, labs, notes, testing and imaging myself where available.  Lab Results  Component Value Date   HGB 14.4 06/06/2010   ASSESSMENT AND PLAN  57 years old right-handed Caucasian female, with two-month history of perianal, perivaginal area, and the left groin area burning pain, essentially normal neurological examination, reported normal gynecology, and colonoscopy workup.  1.  MRI of pelvic to rule out pelvic area pathology 2.  Cymbalta 30 mg for symptomatic treatment. 3.  return to clinic in 2-3 months      Levert Feinstein, M.D. Ph.D.  [facility] [address] [place], [place] [address] [phone]

## 2013-01-23 NOTE — Telephone Encounter (Signed)
Returned patients call. No answer.

## 2013-01-24 ENCOUNTER — Other Ambulatory Visit: Payer: Self-pay

## 2013-01-24 MED ORDER — DULOXETINE HCL 30 MG PO CPEP: 30.0000 mg | ORAL_CAPSULE | Freq: Every day | ORAL | Status: DC

## 2013-01-24 NOTE — Telephone Encounter (Signed)
OV notes say: 2. Cymbalta 30 mg for symptomatic treatment Drug was not added to med list.  I have updated list and sent Rx.

## 2013-02-03 ENCOUNTER — Telehealth: Payer: Self-pay | Admitting: Neurology

## 2013-02-03 MED ORDER — CYCLOBENZAPRINE HCL 5 MG PO TABS: 5.0000 mg | ORAL_TABLET | Freq: Two times a day (BID) | ORAL | Status: DC

## 2013-02-03 NOTE — Telephone Encounter (Signed)
Dr. Anne Hahn,   Dr. Terrace Arabia patient is having issues with Cymbalta, causing stomach problems. She wants something else called in, she stated Flexeril. Please advise.

## 2013-02-03 NOTE — Telephone Encounter (Signed)
I called patient. The patient is having side effects on the Cymbalta with nausea and diarrhea. The patient will stop the medication, and we will try Flexeril, which seemed to help in the past. I'll have her go on 5 mg twice daily.

## 2013-05-09 ENCOUNTER — Other Ambulatory Visit: Payer: Self-pay

## 2013-05-09 ENCOUNTER — Ambulatory Visit: Payer: BC Managed Care – PPO | Admitting: Neurology

## 2013-05-09 DIAGNOSIS — Z1231 Encounter for screening mammogram for malignant neoplasm of breast: Secondary | ICD-10-CM

## 2013-06-14 ENCOUNTER — Encounter: Payer: Self-pay | Admitting: Women's Health

## 2013-06-14 ENCOUNTER — Ambulatory Visit
Admission: RE | Admit: 2013-06-14 | Discharge: 2013-06-14 | Disposition: A | Discharge status: Home / Self Care | Payer: BC Managed Care – PPO | Source: Ambulatory Visit

## 2013-06-14 ENCOUNTER — Ambulatory Visit (INDEPENDENT_AMBULATORY_CARE_PROVIDER_SITE_OTHER): Payer: BC Managed Care – PPO | Admitting: Women's Health

## 2013-06-14 VITALS — BP 112/72 | Ht 63.0 in | Wt 138.0 lb

## 2013-06-14 DIAGNOSIS — Z79890 Hormone replacement therapy (postmenopausal): Secondary | ICD-10-CM

## 2013-06-14 DIAGNOSIS — Z01419 Encounter for gynecological examination (general) (routine) without abnormal findings: Secondary | ICD-10-CM

## 2013-06-14 DIAGNOSIS — N870 Mild cervical dysplasia: Secondary | ICD-10-CM

## 2013-06-14 DIAGNOSIS — Z1231 Encounter for screening mammogram for malignant neoplasm of breast: Secondary | ICD-10-CM

## 2013-06-14 DIAGNOSIS — M8580 Other specified disorders of bone density and structure, unspecified site: Secondary | ICD-10-CM

## 2013-06-14 DIAGNOSIS — M899 Disorder of bone, unspecified: Secondary | ICD-10-CM

## 2013-06-14 MED ORDER — ESTRADIOL ACETATE 0.05 MG/24HR VA RING: VAGINAL_RING | VAGINAL | Status: DC

## 2013-06-14 NOTE — Patient Instructions (Signed)
Health Recommendations for Postmenopausal Women Respected and ongoing research has looked at the most common causes of death, disability, and poor quality of life in postmenopausal women. The causes include heart disease, diseases of blood vessels, diabetes, depression, cancer, and bone loss (osteoporosis). Many things can be done to help lower the chances of developing these and other common problems: CARDIOVASCULAR DISEASE Heart Disease: A heart attack is a medical emergency. Know the signs and symptoms of a heart attack. Below are things women can do to reduce their risk for heart disease.   Do not smoke. If you smoke, quit.  Aim for a healthy weight. Being overweight causes many preventable deaths. Eat a healthy and balanced diet and drink an adequate amount of liquids.  Get moving. Make a commitment to be more physically active. Aim for 30 minutes of activity on most, if not all days of the week.  Eat for heart health. Choose a diet that is low in saturated fat and cholesterol and eliminate trans fat. Include whole grains, vegetables, and fruits. Read and understand the labels on food containers before buying.  Know your numbers. Ask your caregiver to check your blood pressure, cholesterol (total, HDL, LDL, triglycerides) and blood glucose. Work with your caregiver on improving your entire clinical picture.  High blood pressure. Limit or stop your table salt intake (try salt substitute and food seasonings). Avoid salty foods and drinks. Read labels on food containers before buying. Eating well and exercising can help control high blood pressure. STROKE  Stroke is a medical emergency. Stroke may be the result of a blood clot in a blood vessel in the brain or by a brain hemorrhage (bleeding). Know the signs and symptoms of a stroke. To lower the risk of developing a stroke:  Avoid fatty foods.  Quit smoking.  Control your diabetes, blood pressure, and irregular heart rate. THROMBOPHLEBITIS  (BLOOD CLOT) OF THE LEG  Becoming overweight and leading a stationary lifestyle may also contribute to developing blood clots. Controlling your diet and exercising will help lower the risk of developing blood clots. CANCER SCREENING  Breast Cancer: Take steps to reduce your risk of breast cancer.  You should practice "breast self-awareness." This means understanding the normal appearance and feel of your breasts and should include breast self-examination. Any changes detected, no matter how small, should be reported to your caregiver.  After age 40, you should have a clinical breast exam (CBE) every year.  Starting at age 40, you should consider having a mammogram (breast X-ray) every year.  If you have a family history of breast cancer, talk to your caregiver about genetic screening.  If you are at high risk for breast cancer, talk to your caregiver about having an MRI and a mammogram every year.  Intestinal or Stomach Cancer: Tests to consider are a rectal exam, fecal occult blood, sigmoidoscopy, and colonoscopy. Women who are high risk may need to be screened at an earlier age and more often.  Cervical Cancer:  Beginning at age 30, you should have a Pap test every 3 years as long as the past 3 Pap tests have been normal.  If you have had past treatment for cervical cancer or a condition that could lead to cancer, you need Pap tests and screening for cancer for at least 20 years after your treatment.  If you had a hysterectomy for a problem that was not cancer or a condition that could lead to cancer, then you no longer need Pap tests.    If you are between ages 65 and 70, and you have had normal Pap tests going back 10 years, you no longer need Pap tests.  If Pap tests have been discontinued, risk factors (such as a new sexual partner) need to be reassessed to determine if screening should be resumed.  Some medical problems can increase the chance of getting cervical cancer. In these  cases, your caregiver may recommend more frequent screening and Pap tests.  Uterine Cancer: If you have vaginal bleeding after reaching menopause, you should notify your caregiver.  Ovarian cancer: Other than yearly pelvic exams, there are no reliable tests available to screen for ovarian cancer at this time except for yearly pelvic exams.  Lung Cancer: Yearly chest X-rays can detect lung cancer and should be done on high risk women, such as cigarette smokers and women with chronic lung disease (emphysema).  Skin Cancer: A complete body skin exam should be done at your yearly examination. Avoid overexposure to the sun and ultraviolet light lamps. Use a strong sun block cream when in the sun. All of these things are important in lowering the risk of skin cancer. MENOPAUSE Menopause Symptoms: Hormone therapy products are effective for treating symptoms associated with menopause:  Moderate to severe hot flashes.  Night sweats.  Mood swings.  Headaches.  Tiredness.  Loss of sex drive.  Insomnia.  Other symptoms. Hormone replacement carries certain risks, especially in older women. Women who use or are thinking about using estrogen or estrogen with progestin treatments should discuss that with their caregiver. Your caregiver will help you understand the benefits and risks. The ideal dose of hormone replacement therapy is not known. The Food and Drug Administration (FDA) has concluded that hormone therapy should be used only at the lowest doses and for the shortest amount of time to reach treatment goals.  OSTEOPOROSIS Protecting Against Bone Loss and Preventing Fracture: If you use hormone therapy for prevention of bone loss (osteoporosis), the risks for bone loss must outweigh the risk of the therapy. Ask your caregiver about other medications known to be safe and effective for preventing bone loss and fractures. To guard against bone loss or fractures, the following is recommended:  If  you are less than age 50, take 1000 mg of calcium and at least 600 mg of Vitamin D per day.  If you are greater than age 50 but less than age 70, take 1200 mg of calcium and at least 600 mg of Vitamin D per day.  If you are greater than age 70, take 1200 mg of calcium and at least 800 mg of Vitamin D per day. Smoking and excessive alcohol intake increases the risk of osteoporosis. Eat foods rich in calcium and vitamin D and do weight bearing exercises several times a week as your caregiver suggests. DIABETES Diabetes Melitus: If you have Type I or Type 2 diabetes, you should keep your blood sugar under control with diet, exercise and recommended medication. Avoid too many sweets, starchy and fatty foods. Being overweight can make control more difficult. COGNITION AND MEMORY Cognition and Memory: Menopausal hormone therapy is not recommended for the prevention of cognitive disorders such as Alzheimer's disease or memory loss.  DEPRESSION  Depression may occur at any age, but is common in elderly women. The reasons may be because of physical, medical, social (loneliness), or financial problems and needs. If you are experiencing depression because of medical problems and control of symptoms, talk to your caregiver about this. Physical activity and   exercise may help with mood and sleep. Community and volunteer involvement may help your sense of value and worth. If you have depression and you feel that the problem is getting worse or becoming severe, talk to your caregiver about treatment options that are best for you. ACCIDENTS  Accidents are common and can be serious in the elderly woman. Prepare your house to prevent accidents. Eliminate throw rugs, place hand bars in the bath, shower and toilet areas. Avoid wearing high heeled shoes or walking on wet, snowy, and icy areas. Limit or stop driving if you have vision or hearing problems, or you feel you are unsteady with you movements and  reflexes. HEPATITIS C Hepatitis C is a type of viral infection affecting the liver. It is spread mainly through contact with blood from an infected person. It can be treated, but if left untreated, it can lead to severe liver damage over years. Many people who are infected do not know that the virus is in their blood. If you are a "baby-boomer", it is recommended that you have one screening test for Hepatitis C. IMMUNIZATIONS  Several immunizations are important to consider having during your senior years, including:   Tetanus, diptheria, and pertussis booster shot.  Influenza every year before the flu season begins.  Pneumonia vaccine.  Shingles vaccine.  Others as indicated based on your specific needs. Talk to your caregiver about these. Document Released: 08/14/2005 Document Revised: 06/08/2012 Document Reviewed: 04/09/2008 Exit[employer] Patient Information 2014 [employer].  

## 2013-06-14 NOTE — Progress Notes (Signed)
April Wood [birth year] [id number]    History:    The patient presents for annual exam.  TAH in 1999 for fibroids and ectopic on Femring. CIN-1 in 1994 with cryo- normal Paps after. 2014 Has had some problems with intermittent low abdominal pain,  Normal gyn ultrasound , evaluated by Dr.Bucchini, had some constipation. 2011 Osteopenia T score -1.1 at left femoral neck. Normal mammogram history. 2011 benign colon polyp. Hyperlipidemia  primary care manages   Past medical history, past surgical history, family history and social history were all reviewed and documented in the EPIC chart. Self-employed, Therapist, sports.   ROS:  A  ROS was performed and pertinent positives and negatives are included in the history.  Exam:  Filed Vitals:   06/14/13 1503  BP: 112/72    General appearance:  Normal Head/Neck:  Normal, without cervical or supraclavicular adenopathy. Thyroid:  Symmetrical, normal in size, without palpable masses or nodularity. Respiratory  Effort:  Normal  Auscultation:  Clear without wheezing or rhonchi Cardiovascular  Auscultation:  Regular rate, without rubs, murmurs or gallops  Edema/varicosities:  Not grossly evident Abdominal  Soft,nontender, without masses, guarding or rebound.  Liver/spleen:  No organomegaly noted  Hernia:  None appreciated  Skin  Inspection:  Grossly normal  Palpation:  Grossly normal Neurologic/psychiatric  Orientation:  Normal with appropriate conversation.  Mood/affect:  Normal  Genitourinary    Breasts: Examined lying and sitting.     Right: Without masses, retractions, discharge or axillary adenopathy.     Left: Without masses, retractions, discharge or axillary adenopathy.   Inguinal/mons:  Normal without inguinal adenopathy  External genitalia:  Normal  BUS/Urethra/Skene's glands:  Normal  Bladder:  Normal  Vagina:  Normal  Cervix: Absent  Uterus:  Absent  Adnexa/parametria:     Rt: Without masses or  tenderness.   Lt: Without masses or tenderness.  Anus and perineum: Normal  Digital rectal exam: Normal sphincter tone without palpated masses or tenderness  Assessment/Plan:  57 y.o.MWF G1P0  for annual exam.    TAH for fibroids on Femring 2011 osteopenia Hyperlipidemia-primary care manages labs and meds  Plan: Repeat DEXA, reviewed importance of continuing regular exercise, calcium rich diet, vitamin D 2000 daily. Femring prescription given, reviewed risks of blood clots, strokes, breast cancer. SBE's, continue annual mammogram, 3-D tomography reviewed and encouraged history of dense breasts. UA only.    Harrington Challenger [facility], 4:17 PM 06/14/2013

## 2013-06-15 ENCOUNTER — Ambulatory Visit: Payer: BC Managed Care – PPO

## 2013-06-22 ENCOUNTER — Other Ambulatory Visit: Payer: Self-pay | Admitting: Internal Medicine

## 2013-06-22 DIAGNOSIS — R928 Other abnormal and inconclusive findings on diagnostic imaging of breast: Secondary | ICD-10-CM

## 2013-06-28 ENCOUNTER — Encounter: Payer: Self-pay | Admitting: Obstetrics and Gynecology

## 2013-07-12 ENCOUNTER — Ambulatory Visit
Admission: RE | Admit: 2013-07-12 | Discharge: 2013-07-12 | Disposition: A | Discharge status: Home / Self Care | Payer: BC Managed Care – PPO | Source: Ambulatory Visit | Attending: Internal Medicine | Admitting: Internal Medicine

## 2013-07-12 DIAGNOSIS — R928 Other abnormal and inconclusive findings on diagnostic imaging of breast: Secondary | ICD-10-CM

## 2014-05-07 ENCOUNTER — Encounter: Payer: Self-pay | Admitting: Women's Health

## 2014-06-14 ENCOUNTER — Other Ambulatory Visit: Payer: Self-pay

## 2014-06-14 DIAGNOSIS — Z1231 Encounter for screening mammogram for malignant neoplasm of breast: Secondary | ICD-10-CM

## 2014-06-19 ENCOUNTER — Ambulatory Visit (INDEPENDENT_AMBULATORY_CARE_PROVIDER_SITE_OTHER): Payer: BC Managed Care – PPO | Admitting: Women's Health

## 2014-06-19 ENCOUNTER — Encounter: Payer: Self-pay | Admitting: Women's Health

## 2014-06-19 VITALS — BP 110/70 | Ht 63.0 in | Wt 138.0 lb

## 2014-06-19 DIAGNOSIS — Z01419 Encounter for gynecological examination (general) (routine) without abnormal findings: Secondary | ICD-10-CM

## 2014-06-19 DIAGNOSIS — Z79890 Hormone replacement therapy (postmenopausal): Secondary | ICD-10-CM

## 2014-06-19 MED ORDER — ESTRADIOL 2 MG VA RING: 2.0000 mg | VAGINAL_RING | VAGINAL | Status: DC

## 2014-06-19 NOTE — Progress Notes (Signed)
April Wood Oct 03, 1955 [id number]    History:    Presents for annual exam.  1999 TAH for fibroids on Femring. 1994 cryo-with normal Paps after. 07/2013 normal after diagnostic mammogram. 2011 benign colon polyp. Hypercholesterolemia managed by primary care.  Past medical history, past surgical history, family history and social history were all reviewed and documented in the EPIC chart. Self-employed, Risk manager.  ROS:  A  12 point ROS was performed and pertinent positives and negatives are included.  Exam:  Filed Vitals:   06/19/14 1435  BP: 110/70    General appearance:  Normal Thyroid:  Symmetrical, normal in size, without palpable masses or nodularity. Respiratory  Auscultation:  Clear without wheezing or rhonchi Cardiovascular  Auscultation:  Regular rate, without rubs, murmurs or gallops  Edema/varicosities:  Not grossly evident Abdominal  Soft,nontender, without masses, guarding or rebound.  Liver/spleen:  No organomegaly noted  Hernia:  None appreciated  Skin  Inspection:  Grossly normal   Breasts: Examined lying and sitting.     Right: Without masses, retractions, discharge or axillary adenopathy.     Left: Without masses, retractions, discharge or axillary adenopathy. Gentitourinary   Inguinal/mons:  Normal without inguinal adenopathy  External genitalia:  Normal  BUS/Urethra/Skene's glands:  Normal  Vagina:  Normal  Cervix and uterus:  Absent  Adnexa/parametria:     Rt: Without masses or tenderness.   Lt: Without masses or tenderness.  Anus and perineum: Normal  Digital rectal exam: Normal sphincter tone without palpated masses or tenderness  Assessment/Plan:  58 y.o. M WF G1 P0 for annual exam.     TAH/fibroids on Femring/complained of cost Hypercholesteremia labs and meds primary care  Plan: HRT options reviewed, will try Estring will call if problems with change or cost, reviewed less systemic absorption best to use HRT shortest amount of time  risks of blood clots, strokes and breast cancer reviewed. SBE's, continue annual mammogram, 3-D tomography reviewed and encouraged history of dense breast. Continue regular exercise, calcium rich diet, vitamin D 2000 daily encouraged. Bone density reviewed and encouraged states insurance does not cover. Home safety, fall prevention and importance of regular weightbearing exercise reviewed.    Huel Cote [facility], 5:51 PM 06/19/2014

## 2014-06-19 NOTE — Patient Instructions (Signed)

## 2014-06-20 LAB — URINALYSIS W MICROSCOPIC + REFLEX CULTURE
Bilirubin Urine: NEGATIVE
CASTS: NONE SEEN
Crystals: NONE SEEN
Glucose, UA: NEGATIVE mg/dL
KETONES UR: NEGATIVE mg/dL
Leukocytes, UA: NEGATIVE
NITRITE: NEGATIVE
PH: 6 (ref 5.0–8.0)
Protein, ur: NEGATIVE mg/dL
SPECIFIC GRAVITY, URINE: 1.023 (ref 1.005–1.030)
Urobilinogen, UA: 0.2 mg/dL (ref 0.0–1.0)

## 2014-07-13 ENCOUNTER — Ambulatory Visit
Admission: RE | Admit: 2014-07-13 | Discharge: 2014-07-13 | Disposition: A | Discharge status: Home / Self Care | Payer: BLUE CROSS/BLUE SHIELD | Source: Ambulatory Visit

## 2014-07-13 DIAGNOSIS — Z1231 Encounter for screening mammogram for malignant neoplasm of breast: Secondary | ICD-10-CM

## 2014-10-15 ENCOUNTER — Telehealth: Payer: Self-pay | Admitting: *Deleted

## 2014-10-15 DIAGNOSIS — Z79890 Hormone replacement therapy (postmenopausal): Secondary | ICD-10-CM

## 2014-10-15 MED ORDER — ESTRADIOL ACETATE 0.05 MG/24HR VA RING: VAGINAL_RING | VAGINAL | Status: DC

## 2014-10-15 NOTE — Telephone Encounter (Signed)
estring will usually only have affect on vagina, not usually effective for hot flashes, femring has more systemic action and more risks, ok to  Start back on femring per vagina every 3 months.

## 2014-10-15 NOTE — Telephone Encounter (Signed)
(  Pt aware you are out of the office) pt was prescribed estring states it is not working with hot flashes and night sweats. Pt asked if she could switch back to femring or if you have other recommendations? Please advise

## 2014-10-15 NOTE — Telephone Encounter (Signed)
Left the below on voicemail, Rx sent.  

## 2015-06-10 ENCOUNTER — Other Ambulatory Visit: Payer: Self-pay

## 2015-06-10 DIAGNOSIS — Z1231 Encounter for screening mammogram for malignant neoplasm of breast: Secondary | ICD-10-CM

## 2015-06-21 ENCOUNTER — Encounter: Payer: Self-pay | Admitting: Women's Health

## 2015-06-21 ENCOUNTER — Ambulatory Visit (INDEPENDENT_AMBULATORY_CARE_PROVIDER_SITE_OTHER): Payer: BLUE CROSS/BLUE SHIELD | Admitting: Women's Health

## 2015-06-21 VITALS — BP 120/78 | Wt 140.0 lb

## 2015-06-21 DIAGNOSIS — Z13820 Encounter for screening for osteoporosis: Secondary | ICD-10-CM

## 2015-06-21 DIAGNOSIS — Z79890 Hormone replacement therapy (postmenopausal): Secondary | ICD-10-CM | POA: Diagnosis not present

## 2015-06-21 DIAGNOSIS — Z01419 Encounter for gynecological examination (general) (routine) without abnormal findings: Secondary | ICD-10-CM

## 2015-06-21 DIAGNOSIS — M899 Disorder of bone, unspecified: Secondary | ICD-10-CM

## 2015-06-21 DIAGNOSIS — M8580 Other specified disorders of bone density and structure, unspecified site: Secondary | ICD-10-CM

## 2015-06-21 MED ORDER — ESTRADIOL ACETATE 0.05 MG/24HR VA RING: VAGINAL_RING | VAGINAL | Status: DC

## 2015-06-21 MED ORDER — ESTRADIOL 0.05 MG/24HR TD PTWK: 0.0500 mg | MEDICATED_PATCH | TRANSDERMAL | Status: DC

## 2015-06-21 NOTE — Progress Notes (Signed)
April Wood [birth year] [id number]    History:    Presents for annual exam.  1999 TAH for fibroids on Femring. Continues to have hot flashes if all Femring. Femring cost greater than $300 requesting change. 1994 cryo-with normal Paps after. Normal mammogram history. 2011 benign colon polyps repeat next year will schedule.  2011 T score -1.1 femoral neck FRAX 5.6%/0.3%.  Past medical history, past surgical history, family history and social history were all reviewed and documented in the EPIC chart. 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 Husband scheduled for back surgery first of the year.  ROS:  A ROS was performed and pertinent positives and negatives are included.  Exam:  Filed Vitals:   06/21/15 1435  BP: 120/78    General appearance:  Normal Thyroid:  Symmetrical, normal in size, without palpable masses or nodularity. Respiratory  Auscultation:  Clear without wheezing or rhonchi Cardiovascular  Auscultation:  Regular rate, without rubs, murmurs or gallops  Edema/varicosities:  Not grossly evident Abdominal  Soft,nontender, without masses, guarding or rebound.  Liver/spleen:  No organomegaly noted  Hernia:  None appreciated  Skin  Inspection:  Grossly normal   Breasts: Examined lying and sitting.     Right: Without masses, retractions, discharge or axillary adenopathy.     Left: Without masses, retractions, discharge or axillary adenopathy. Gentitourinary   Inguinal/mons:  Normal without inguinal adenopathy  External genitalia:  Normal  BUS/Urethra/Skene's glands:  Normal  Vagina:  Normal  Cervix:  And uterus absent Adnexa/parametria:     Rt: Without masses or tenderness.   Lt: Without masses or tenderness.  Anus and perineum: Normal  Digital rectal exam: Normal sphincter tone without palpated masses or tenderness  Assessment/Plan:  59 y.o. M WF G1 P0  for annual exam.    1999 TAH for fibroids on Femring 1994 cryo-with normal Paps after Hypercholesteremia-primary  care manages labs and meds Osteopenia without elevated FRAX  Plan: HRT reviewed, risks of blood clots, strokes, breast cancer, reviewed best to use shortest amount of time. Would like to continue, Will try estradiol 0.05 patch weekly, proper use, accepts risks will call if problems with the change. SBE's, continue annual 3-D mammogram history of dense breast. Continue regular exercise, works out most days of the week, calcium rich diet, calcium with D supplement. Reports normal vitamin D level at primary care. Zostavax recommended at age 44, schedule DEXA. And safety, fall prevention and importance of continuing weightbearing exercise reviewed. Marland Kitchen    [place], 3:29 PM 06/21/2015

## 2015-06-21 NOTE — Patient Instructions (Signed)

## 2015-07-17 ENCOUNTER — Ambulatory Visit
Admission: RE | Admit: 2015-07-17 | Discharge: 2015-07-17 | Disposition: A | Discharge status: Home / Self Care | Payer: BLUE CROSS/BLUE SHIELD | Source: Ambulatory Visit

## 2015-07-17 DIAGNOSIS — Z1231 Encounter for screening mammogram for malignant neoplasm of breast: Secondary | ICD-10-CM

## 2016-06-12 ENCOUNTER — Other Ambulatory Visit: Payer: Self-pay | Admitting: General Surgery

## 2016-06-23 ENCOUNTER — Encounter: Payer: Self-pay | Admitting: Women's Health

## 2016-06-23 ENCOUNTER — Ambulatory Visit (INDEPENDENT_AMBULATORY_CARE_PROVIDER_SITE_OTHER): Payer: BLUE CROSS/BLUE SHIELD | Admitting: Women's Health

## 2016-06-23 VITALS — BP 118/80 | Ht 63.0 in | Wt 138.0 lb

## 2016-06-23 DIAGNOSIS — Z01419 Encounter for gynecological examination (general) (routine) without abnormal findings: Secondary | ICD-10-CM | POA: Diagnosis not present

## 2016-06-23 NOTE — Patient Instructions (Signed)

## 2016-06-23 NOTE — Progress Notes (Signed)
April Wood 02-Dec-1958 [id number]    History:    Presents for annual exam.  TAH for fibroids. 1994 CIN-1 with cryo-with normal Paps after. Hypercholesterolemia primary care manages. Normal mammogram history. Has been on Climara patch with good relief of menopausal symptoms and desires to stop. 2011 T score -1.1 femoral neck FRAX 5.6%/0.3% did not repeat/ not covered with insurance. 06/2016 had a lipoma removed from left lower abdomen and left outer thigh. Has not had Zostavax. Rare intercourse.  Past medical history, past surgical history, family history and social history were all reviewed and documented in the EPIC chart. Property management self-employed. Exercises most days of the week.  ROS:  A ROS was performed and pertinent positives and negatives are included.  Exam:  Vitals:   06/23/16 1411  BP: 118/80  Weight: 138 lb (62.6 kg)  Height: 5\' 3"  (1.6 m)   Body mass index is 24.45 kg/m.   General appearance:  Normal Thyroid:  Symmetrical, normal in size, without palpable masses or nodularity. Respiratory  Auscultation:  Clear without wheezing or rhonchi Cardiovascular  Auscultation:  Regular rate, without rubs, murmurs or gallops  Edema/varicosities:  Not grossly evident Abdominal  Soft,nontender, without masses, guarding or rebound.  Liver/spleen:  No organomegaly noted  Hernia:  None appreciated  Skin  Inspection:  Grossly normal   Breasts: Examined lying and sitting.     Right: Without masses, retractions, discharge or axillary adenopathy.     Left: Without masses, retractions, discharge or axillary adenopathy. Gentitourinary   Inguinal/mons:  Normal without inguinal adenopathy  External genitalia:  Atrophic  BUS/Urethra/Skene's glands:  Normal  Vagina:  Atrophic  Cervix:  And uterus absent              Adnexa/parametria:     Rt: Without masses or tenderness.   Lt: Without masses or tenderness.  Anus and perineum: Normal  Digital rectal exam: Normal sphincter  tone without palpated masses or tenderness  Assessment/Plan:  60 y.o. M WF G1 P0  for annual exam with no complaints.  TAH for fibroids on estradiol patch 0.05 weekly Hypercholesterolemia-primary care manages labs and meds Osteopenia without elevated FRAX  Plan: HRT reviewed risks of blood clots, strokes and breast cancer will stop, instructed to call if problems. SBE's, continue annual screening mammogram, calcium rich diet, vitamin D as recommended by primary care. Home safety, fall prevention and importance of continuing weightbearing exercise reviewed. Repeat DEXA. UA. Zostavax recommended.     April Wood April Wood, 2:57 PM 06/23/2016

## 2016-06-25 ENCOUNTER — Other Ambulatory Visit: Payer: Self-pay

## 2016-06-25 DIAGNOSIS — Z1231 Encounter for screening mammogram for malignant neoplasm of breast: Secondary | ICD-10-CM

## 2017-09-23 ENCOUNTER — Ambulatory Visit
Admission: RE | Admit: 2017-09-23 | Discharge: 2017-09-23 | Disposition: A | Discharge status: Home / Self Care | Payer: Self-pay | Source: Ambulatory Visit | Attending: Internal Medicine | Admitting: Internal Medicine

## 2017-09-23 ENCOUNTER — Other Ambulatory Visit: Payer: Self-pay | Admitting: Internal Medicine

## 2017-09-23 DIAGNOSIS — Z139 Encounter for screening, unspecified: Secondary | ICD-10-CM

## 2018-07-12 ENCOUNTER — Encounter: Payer: Self-pay | Admitting: Women's Health

## 2018-07-12 ENCOUNTER — Ambulatory Visit (INDEPENDENT_AMBULATORY_CARE_PROVIDER_SITE_OTHER): Payer: Self-pay | Admitting: Women's Health

## 2018-07-12 VITALS — BP 110/80 | Ht 63.0 in | Wt 135.0 lb

## 2018-07-12 DIAGNOSIS — Z01419 Encounter for gynecological examination (general) (routine) without abnormal findings: Secondary | ICD-10-CM

## 2018-07-12 NOTE — Progress Notes (Signed)
April Wood [birth year] [id number]    History:    Presents for annual exam.  1994 CIN-1 - cryo and normal Paps after, TAH for fibroids.  Primary care manages hypercholesteremia.  Normal mammogram history.  Has had a screening colonoscopy benign polyps, unsure of date, Dr. Cristina Gong..  2011 T score -1.1 FRAX 5.6% / 0.3% declines repeat.  Exercises daily does Pilates.  Stopped Climara patch in 2019, hot flashes persist.  Shingrex this year.  Rare sexual intercourse.  Past medical history, past surgical history, family history and social history were all reviewed and documented in the EPIC chart.  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  ROS:  A ROS was performed and pertinent positives and negatives are included.  Exam:  Vitals:   07/12/18 1404  BP: 110/80  Weight: 135 lb (61.2 kg)  Height: 5\' 3"  (1.6 m)   Body mass index is 23.91 kg/m.   General appearance:  Normal Thyroid:  Symmetrical, normal in size, without palpable masses or nodularity. Respiratory  Auscultation:  Clear without wheezing or rhonchi Cardiovascular  Auscultation:  Regular rate, without rubs, murmurs or gallops  Edema/varicosities:  Not grossly evident Abdominal  Soft,nontender, without masses, guarding or rebound.  Liver/spleen:  No organomegaly noted  Hernia:  None appreciated  Skin  Inspection:  Grossly normal   Breasts: Examined lying and sitting.     Right: Without masses, retractions, discharge or axillary adenopathy.     Left: Without masses, retractions, discharge or axillary adenopathy. Gentitourinary   Inguinal/mons:  Normal without inguinal adenopathy  External genitalia:  Normal  BUS/Urethra/Skene's glands:  Normal  Vagina: Atrophic  Cervix: And uterus absent  Adnexa/parametria:     Rt: Without masses or tenderness.   Lt: Without masses or tenderness.  Anus and perineum: Normal  Digital rectal exam: Normal sphincter tone without palpated masses or  tenderness  Assessment/Plan:  63 y.o. MWF G1, P0 for annual exam with no complaints.  TAH for fibroids on no HRT Hypercholesteremia primary care manages labs and meds Osteopenia without elevated FRAX  Plan: SBEs, continue annual screening mammogram, calcium rich foods, vitamin D 2000 daily encouraged.  Home safety, fall prevention and importance of weightbearing and balance type exercise reviewed.  Has a healthy lifestyle and will continue.  Declines repeat DEXA.  Encouraged vaginal lubricants, declines vaginal estrogen.    [place], 2:46 PM 07/12/2018

## 2018-07-12 NOTE — Patient Instructions (Signed)

## 2018-08-15 ENCOUNTER — Telehealth: Payer: Self-pay | Admitting: *Deleted

## 2018-08-15 NOTE — Telephone Encounter (Signed)
Telephoned patient, left a message to return a call to [facility]  regarding mammo appointment.

## 2018-08-17 ENCOUNTER — Other Ambulatory Visit (HOSPITAL_COMMUNITY): Payer: Self-pay | Admitting: *Deleted

## 2018-08-17 DIAGNOSIS — Z1231 Encounter for screening mammogram for malignant neoplasm of breast: Secondary | ICD-10-CM

## 2018-10-18 ENCOUNTER — Telehealth (HOSPITAL_COMMUNITY): Payer: Self-pay

## 2018-10-18 NOTE — Telephone Encounter (Signed)
Left message with patient about [facility] appointment that will need to be rescheduled due to [place]. Left name and number for patient to call back.

## 2018-10-19 ENCOUNTER — Other Ambulatory Visit (HOSPITAL_COMMUNITY): Payer: Self-pay | Admitting: *Deleted

## 2018-10-19 DIAGNOSIS — Z1231 Encounter for screening mammogram for malignant neoplasm of breast: Secondary | ICD-10-CM

## 2018-11-24 ENCOUNTER — Ambulatory Visit (HOSPITAL_COMMUNITY): Payer: No Typology Code available for payment source

## 2019-03-09 ENCOUNTER — Other Ambulatory Visit: Payer: Self-pay

## 2019-03-09 ENCOUNTER — Ambulatory Visit
Admission: RE | Admit: 2019-03-09 | Discharge: 2019-03-09 | Disposition: A | Discharge status: Home / Self Care | Payer: No Typology Code available for payment source | Source: Ambulatory Visit | Attending: Obstetrics and Gynecology | Admitting: Obstetrics and Gynecology

## 2019-03-09 ENCOUNTER — Ambulatory Visit (HOSPITAL_COMMUNITY)
Admission: RE | Admit: 2019-03-09 | Discharge: 2019-03-09 | Disposition: A | Discharge status: Home / Self Care | Payer: No Typology Code available for payment source | Source: Ambulatory Visit | Attending: Obstetrics and Gynecology | Admitting: Obstetrics and Gynecology

## 2019-03-09 ENCOUNTER — Encounter (HOSPITAL_COMMUNITY): Payer: Self-pay

## 2019-03-09 DIAGNOSIS — Z1231 Encounter for screening mammogram for malignant neoplasm of breast: Secondary | ICD-10-CM

## 2019-03-09 DIAGNOSIS — Z1239 Encounter for other screening for malignant neoplasm of breast: Secondary | ICD-10-CM

## 2019-03-09 NOTE — Progress Notes (Signed)
No complaints today.   Pap Smear: Pap smear not completed today. Last Pap smear was 06/08/2011 at [facility] and normal. Per patient has a history of an abnormal Pap smear many years ago prior to her hysterectomy in 1999. Patient stated that she had cryotherapy for follow-up. Patient stated all Pap smears were normal after cryotherapy and had more than three consecutive normal Pap smears. Patient has a history of a hysterectomy in 1999 for AUB and fibroids. Patient doesn't need any further Pap smears due to her history of a hysterectomy for benign reasons per BCCCP and ACOG guidelines. Last Pap smear result in Epic.  Physical exam: Breasts Breasts symmetrical. No skin abnormalities bilateral breasts. No nipple retraction bilateral breasts. No nipple discharge bilateral breasts. No lymphadenopathy. No lumps palpated bilateral breasts. No complaints of pain or tenderness on exam. Referred patient to the [facility] for a screening mammogram. Appointment scheduled for Thursday, March 09, 2019 at 1630.        Pelvic/Bimanual No Pap smear completed today since patient has a history of a hysterectomy for benign reasons. Pap smear not indicated per BCCCP guidelines.   Smoking History: Patient has never smoked.  Patient Navigation: Patient education provided. Access to services provided for patient through [facility] program.   Colorectal Cancer Screening: Per patient had a colonoscopy completed in 2013. No complaints today.   Breast and Cervical Cancer Risk Assessment: Patient has no family history of breast cancer, known genetic mutations, or radiation treatment to the chest before age 60. Per patient has a history of cervical dysplasia. Patient has no history of being immunocompromised or DES exposure in-utero.  Risk Assessment    Risk Scores      03/09/2019   Last edited by: Loletta Parish, RN   5-year risk: 1.6 %   Lifetime risk: 6.8 %

## 2019-03-09 NOTE — Patient Instructions (Signed)
Explained breast self awareness with [name]. Patient did not need a Pap smear today due to patient has a history of a hysterectomy for benign reasons. Let patient know that she doesn't need any further Pap smears due to her history of a hysterectomy for benign reasons. Referred patient to the [facility] for a screening mammogram. Appointment scheduled for Thursday, March 09, 2019 at 1630. Patient aware of appointment and will be there. Let [facility] know [facility] will follow up with her within the next couple weeks with results of mammogram by letter or phone. [name] verbalized understanding.  Jazzalynn Rhudy, Arvil Chaco, RN 4:12 PM

## 2019-03-20 ENCOUNTER — Encounter (HOSPITAL_COMMUNITY): Payer: Self-pay | Admitting: *Deleted

## 2020-06-04 IMAGING — MG MM DIGITAL SCREENING BILAT W/ TOMO W/ CAD
8 series · 8 of 24 positions shown · non-contrast
Comparison: Previous exam(s).

CLINICAL DATA: Screening.

EXAM:
DIGITAL SCREENING BILATERAL MAMMOGRAM WITH TOMO AND CAD

[L CC synth-2D]
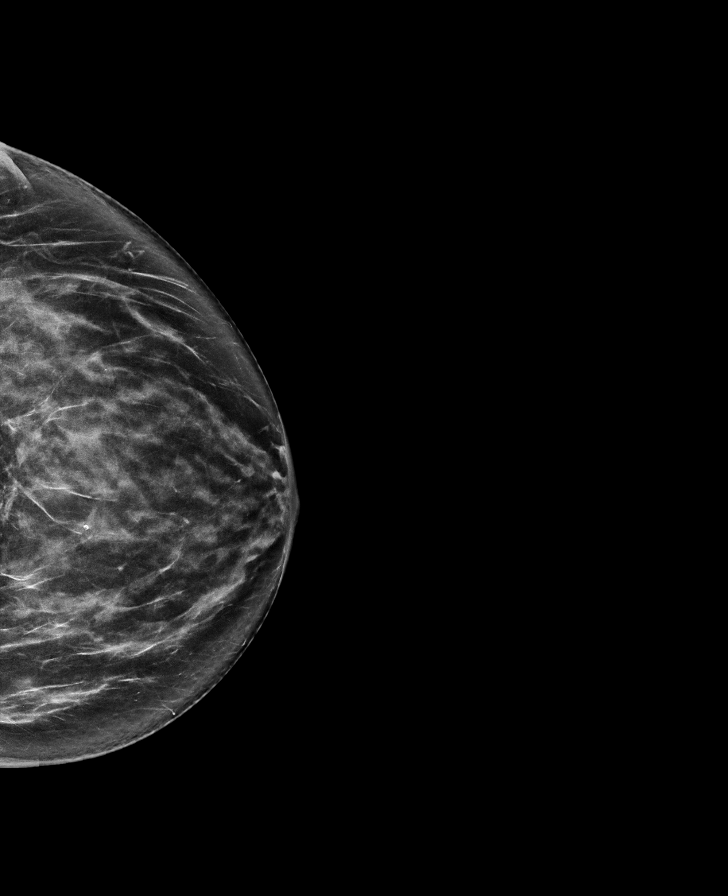

[R CC synth-2D]
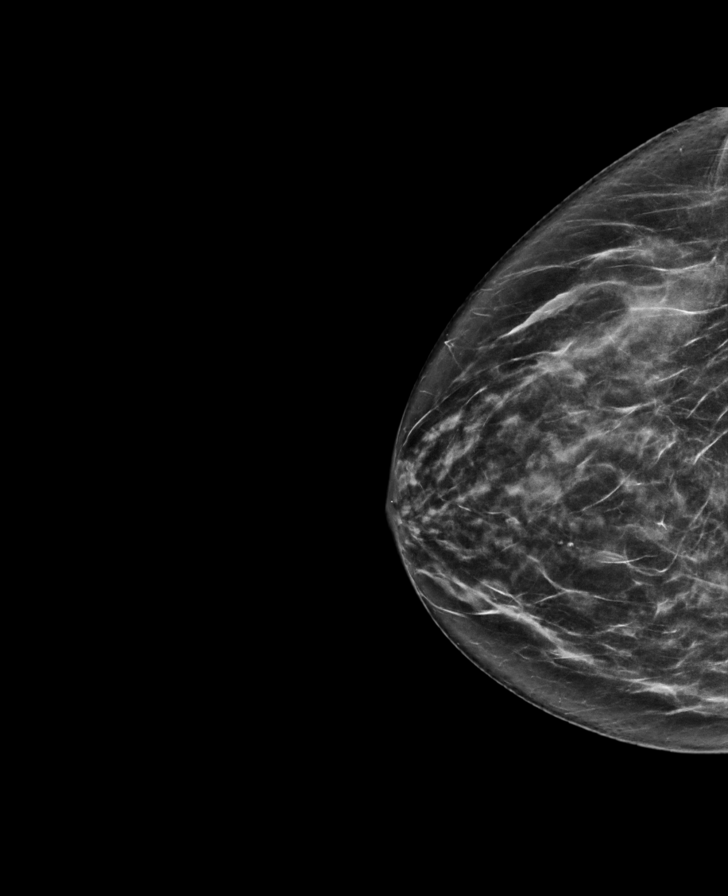

[L MLO synth-2D]
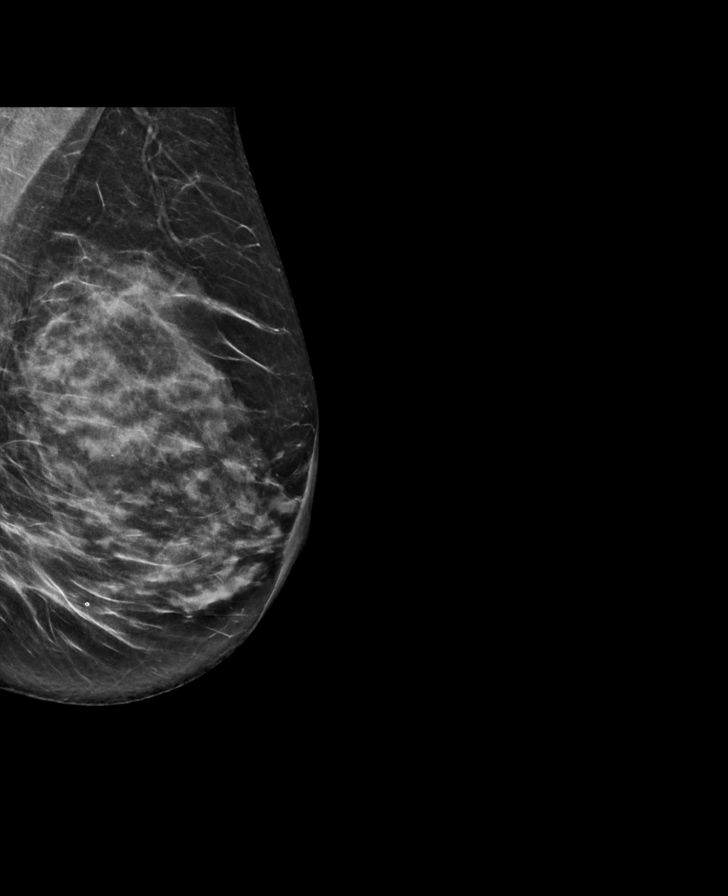

[R MLO synth-2D]
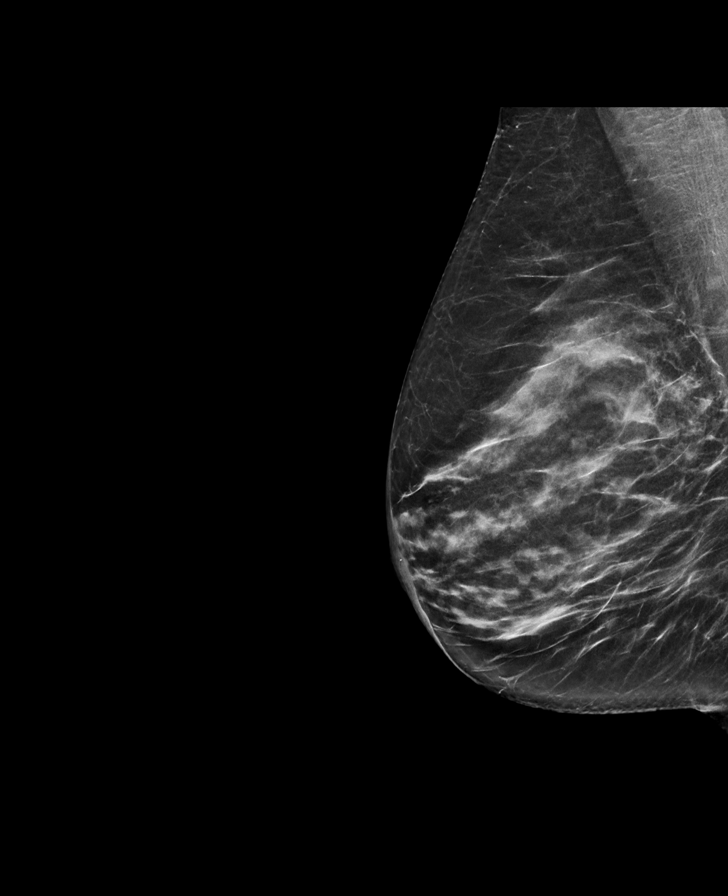

[L MLO tomo · tomo slice 34/67.0]
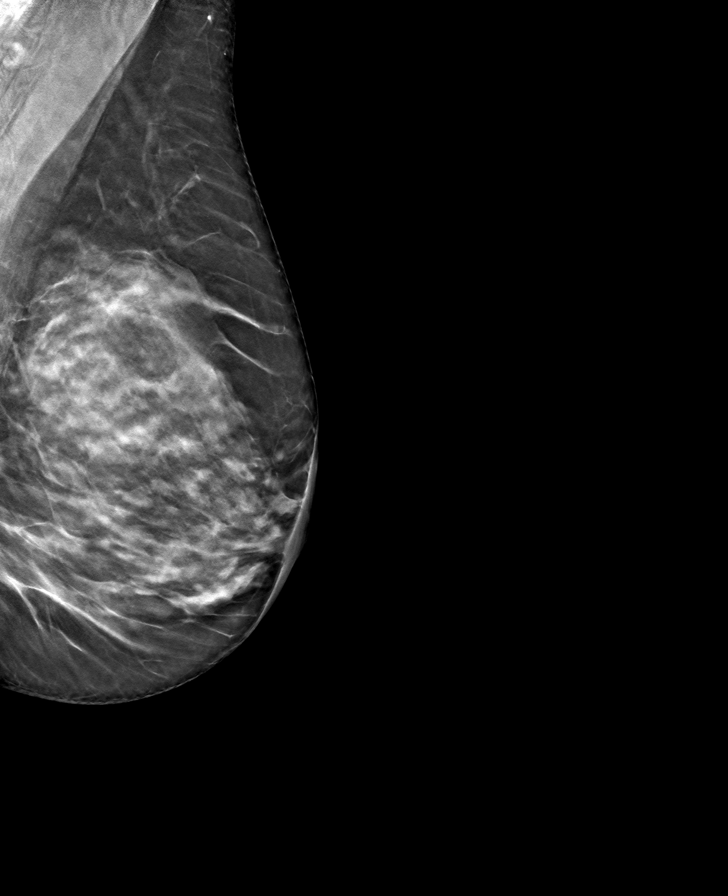

[R CC tomo · tomo slice 34/67.0]
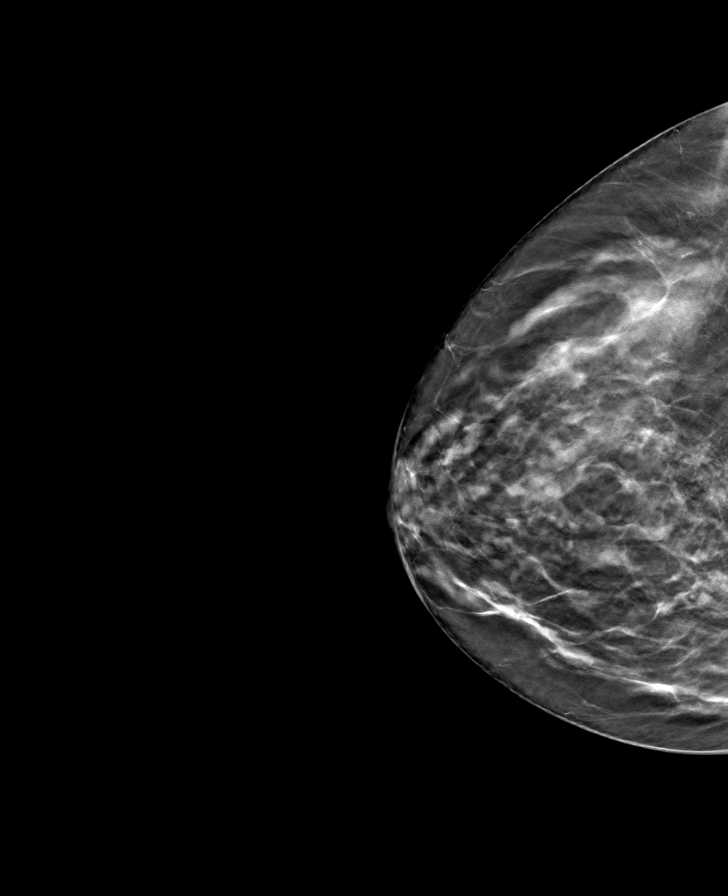

[L CC tomo · tomo slice 36/71.0]
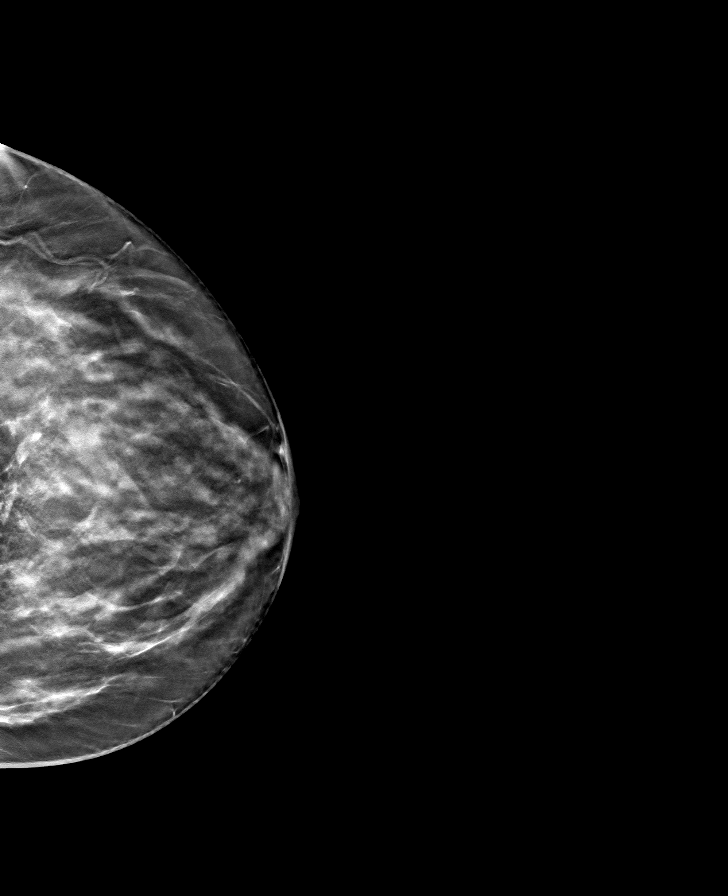

[R MLO tomo · tomo slice 33/65.0]
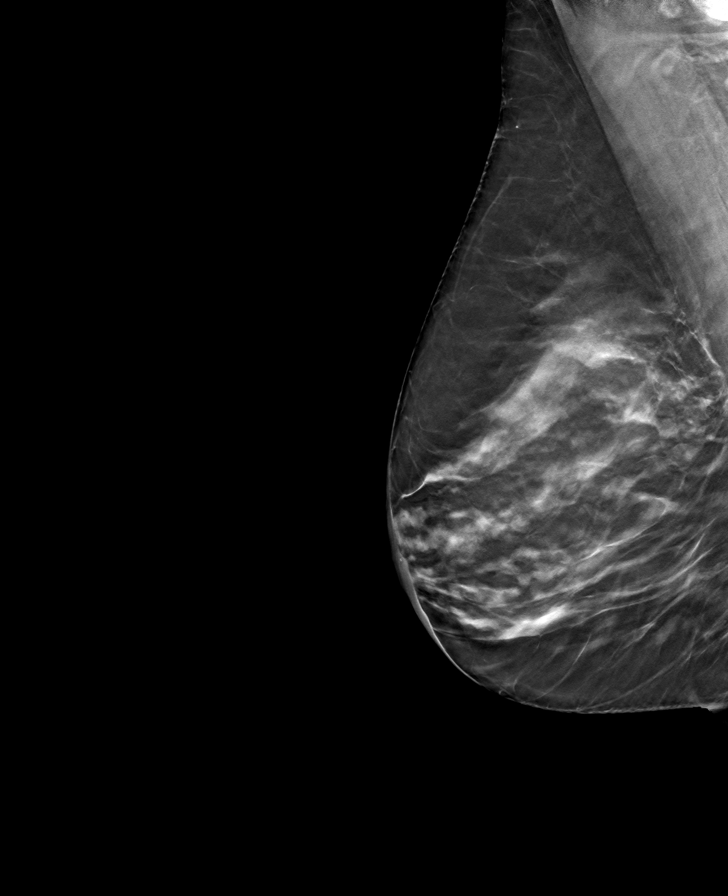

[8 of 24 positions shown; findings below may reference images not displayed]

ACR Breast Density Category c: The breast tissue is heterogeneously
dense, which may obscure small masses.
FINDINGS: There are no findings suspicious for malignancy. Images were
processed with CAD.
IMPRESSION: No mammographic evidence of malignancy. A result letter of this
screening mammogram will be mailed directly to the patient.

RECOMMENDATION:
Screening mammogram in one year. (Code:[id number]-[id number])

BI-RADS CATEGORY  1: Negative.

## 2020-10-31 ENCOUNTER — Other Ambulatory Visit: Payer: Self-pay | Admitting: Internal Medicine

## 2020-10-31 DIAGNOSIS — Z1231 Encounter for screening mammogram for malignant neoplasm of breast: Secondary | ICD-10-CM

## 2020-11-11 DIAGNOSIS — H524 Presbyopia: Secondary | ICD-10-CM | POA: Diagnosis not present

## 2020-11-11 DIAGNOSIS — H25813 Combined forms of age-related cataract, bilateral: Secondary | ICD-10-CM | POA: Diagnosis not present

## 2020-11-11 DIAGNOSIS — H5213 Myopia, bilateral: Secondary | ICD-10-CM | POA: Diagnosis not present

## 2020-11-12 ENCOUNTER — Other Ambulatory Visit: Payer: Self-pay

## 2020-11-12 ENCOUNTER — Encounter: Payer: Self-pay | Admitting: Nurse Practitioner

## 2020-11-12 ENCOUNTER — Ambulatory Visit (INDEPENDENT_AMBULATORY_CARE_PROVIDER_SITE_OTHER): Payer: PPO | Admitting: Nurse Practitioner

## 2020-11-12 VITALS — BP 122/74 | Ht 63.0 in | Wt 132.0 lb

## 2020-11-12 DIAGNOSIS — Z01419 Encounter for gynecological examination (general) (routine) without abnormal findings: Secondary | ICD-10-CM | POA: Diagnosis not present

## 2020-11-12 DIAGNOSIS — N951 Menopausal and female climacteric states: Secondary | ICD-10-CM

## 2020-11-12 NOTE — Progress Notes (Signed)
65 y.o. G3P0010 Married White or Caucasian female here for annual exam.      No LMP recorded. Patient has had a hysterectomy.    Still has both ovaries Has a hemorrhoid that is bothering her, has had it for years but bothers her    Sexually active: No.   Too painful Exercising: Yes.    2 hours of exercise at gym everyday Smoker:  No  Healthy relationship with husband, supportive.  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  Health Maintenance: Pap:  06-08-11 History of abnormal Pap:  Yes, cryo >20 yeas ago MMG:  03-09-19 normal, scheduled Jun 2022 Colonoscopy: 2017 BMD:06-24-10 Gardasil: n/a Covid-19:Phizer Hep C testing: Never Screening Labs:PCP   reports that she has never smoked. She has never used smokeless tobacco. She reports previous alcohol use. She reports that she does not use drugs.  Past Medical History:  Diagnosis Date  . Anxiety   . Blood transfusion without reported diagnosis   . CIN I (cervical intraepithelial neoplasia I)   . Dysmenorrhea   . Elevated cholesterol   . Fibroid   . Hyperlipemia   . Insomnia     Past Surgical History:  Procedure Laterality Date  . ABDOMINAL HYSTERECTOMY  1999   TAH  . COLPOSCOPY    . GYNECOLOGIC CRYOSURGERY    . Thumb surg    . TOE SURGERY     Right    Current Outpatient Medications  Medication Sig Dispense Refill  . atorvastatin (LIPITOR) 20 MG tablet Take 10 mg by mouth daily. Reported on 06/21/2015  0  . Calcium Carbonate-Vitamin D (CALCIUM + D PO) Take 1 tablet by mouth daily.    Marland Kitchen zolpidem (AMBIEN) 5 MG tablet Take 5 mg by mouth at bedtime as needed.    Marland Kitchen ibuprofen (ADVIL,MOTRIN) 800 MG tablet Take 1 tablet (800 mg total) by mouth every 8 (eight) hours as needed for pain. (Patient not taking: Reported on 11/12/2020) 30 tablet 1   No current facility-administered medications for this visit.    Family History  Problem Relation Age of Onset  . Heart disease Father   . Lung cancer Father   .  Parkinson's disease Mother   . Heart disease Brother   . Cancer Brother        bladder - smoker  . Colon cancer Maternal Aunt   . Breast cancer Cousin 68       Pat. 1st cousin-Age 9    Review of Systems  Exam:   Ht 5\' 3"  (1.6 m)   Wt 132 lb (59.9 kg)   BMI 23.38 kg/m   Height: 5\' 3"  (160 cm)  General appearance: alert, cooperative and appears stated age, no acute distress Head: Normocephalic, without obvious abnormality Neck: no adenopathy, thyroid normal to inspection and palpation Lungs: clear to auscultation bilaterally Breasts: No axillary or supraclavicular adenopathy, Normal to palpation without dominant masses Heart: regular rate and rhythm Abdomen: soft, non-tender; no masses,  no organomegaly Extremities: extremities normal, no edema Skin: No rashes or lesions Lymph nodes: Cervical, supraclavicular, and axillary nodes normal. No abnormal inguinal nodes palpated Neurologic: Grossly normal   Pelvic: External genitalia:  no lesions              Urethra:  normal appearing urethra with no masses, tenderness or lesions              Bartholins and Skenes: normal  Vagina: normal appearing vagina, appropriate for age, normal appearing discharge, no lesions              Cervix: absent             Bimanual Exam:   Uterus:  uterus absent              Adnexa: no mass, fullness, tenderness                Kim, CMA Chaperone was present for exam.  A:  Well Woman with normal exam  P:   Pap : discontinued r/t hysterectomy  Mammogram: scheduled in June  Labs: with PCP  Medications: discussed vaginal estrogen, pt will call if desires after thinking about it  DEXA: to be scheduled at check out

## 2020-11-12 NOTE — Patient Instructions (Addendum)
Let me know if you would like to explore ways to make sex more comfortable. Some suggestions are vaginal dilators (available on Amazon), lubricant (my favorite is UberLube) and I would recommend vaginal estrogen. There are also sex therapists that can also be helpful.  Health Maintenance After Age 65 After age 78, you are at a higher risk for certain long-term diseases and infections as well as injuries from falls. Falls are a major cause of broken bones and head injuries in people who are older than age 55. Getting regular preventive care can help to keep you healthy and well. Preventive care includes getting regular testing and making lifestyle changes as recommended by your health care provider. Talk with your health care provider about:  Which screenings and tests you should have. A screening is a test that checks for a disease when you have no symptoms.  A diet and exercise plan that is right for you. What should I know about screenings and tests to prevent falls? Screening and testing are the best ways to find a health problem early. Early diagnosis and treatment give you the best chance of managing medical conditions that are common after age 55. Certain conditions and lifestyle choices may make you more likely to have a fall. Your health care provider may recommend:  Regular vision checks. Poor vision and conditions such as cataracts can make you more likely to have a fall. If you wear glasses, make sure to get your prescription updated if your vision changes.  Medicine review. Work with your health care provider to regularly review all of the medicines you are taking, including over-the-counter medicines. Ask your health care provider about any side effects that may make you more likely to have a fall. Tell your health care provider if any medicines that you take make you feel dizzy or sleepy.  Osteoporosis screening. Osteoporosis is a condition that causes the bones to get weaker. This can  make the bones weak and cause them to break more easily.  Blood pressure screening. Blood pressure changes and medicines to control blood pressure can make you feel dizzy.  Strength and balance checks. Your health care provider may recommend certain tests to check your strength and balance while standing, walking, or changing positions.  Foot health exam. Foot pain and numbness, as well as not wearing proper footwear, can make you more likely to have a fall.  Depression screening. You may be more likely to have a fall if you have a fear of falling, feel emotionally low, or feel unable to do activities that you used to do.  Alcohol use screening. Using too much alcohol can affect your balance and may make you more likely to have a fall. What actions can I take to lower my risk of falls? General instructions  Talk with your health care provider about your risks for falling. Tell your health care provider if: ? You fall. Be sure to tell your health care provider about all falls, even ones that seem minor. ? You feel dizzy, sleepy, or off-balance.  Take over-the-counter and prescription medicines only as told by your health care provider. These include any supplements.  Eat a healthy diet and maintain a healthy weight. A healthy diet includes low-fat dairy products, low-fat (lean) meats, and fiber from whole grains, beans, and lots of fruits and vegetables. Home safety  Remove any tripping hazards, such as rugs, cords, and clutter.  Install safety equipment such as grab bars in bathrooms and safety rails on  stairs.  Keep rooms and walkways well-lit. Activity  Follow a regular exercise program to stay fit. This will help you maintain your balance. Ask your health care provider what types of exercise are appropriate for you.  If you need a cane or walker, use it as recommended by your health care provider.  Wear supportive shoes that have nonskid soles.   Lifestyle  Do not drink  alcohol if your health care provider tells you not to drink.  If you drink alcohol, limit how much you have: ? 0-1 drink a day for women. ? 0-2 drinks a day for men.  Be aware of how much alcohol is in your drink. In the [place] one drink equals one typical bottle of beer (12 oz), one-half glass of wine (5 oz), or one shot of hard liquor (1 oz).  Do not use any products that contain nicotine or tobacco, such as cigarettes and e-cigarettes. If you need help quitting, ask your health care provider. Summary  Having a healthy lifestyle and getting preventive care can help to protect your health and wellness after age 15.  Screening and testing are the best way to find a health problem early and help you avoid having a fall. Early diagnosis and treatment give you the best chance for managing medical conditions that are more common for people who are older than age 64.  Falls are a major cause of broken bones and head injuries in people who are older than age 77. Take precautions to prevent a fall at home.  Work with your health care provider to learn what changes you can make to improve your health and wellness and to prevent falls. This information is not intended to replace advice given to you by your health care provider. Make sure you discuss any questions you have with your health care provider. Document Revised: 10/13/2018 Document Reviewed: 05/05/2017 Elsevier Patient Education  2021 [employer].

## 2020-12-20 ENCOUNTER — Ambulatory Visit: Payer: No Typology Code available for payment source

## 2020-12-24 ENCOUNTER — Ambulatory Visit (INDEPENDENT_AMBULATORY_CARE_PROVIDER_SITE_OTHER): Payer: PPO

## 2020-12-24 ENCOUNTER — Other Ambulatory Visit: Payer: Self-pay | Admitting: Nurse Practitioner

## 2020-12-24 ENCOUNTER — Other Ambulatory Visit: Payer: Self-pay

## 2020-12-24 DIAGNOSIS — Z780 Asymptomatic menopausal state: Secondary | ICD-10-CM

## 2020-12-24 DIAGNOSIS — N951 Menopausal and female climacteric states: Secondary | ICD-10-CM

## 2020-12-24 DIAGNOSIS — M8589 Other specified disorders of bone density and structure, multiple sites: Secondary | ICD-10-CM

## 2021-01-14 DIAGNOSIS — M222X2 Patellofemoral disorders, left knee: Secondary | ICD-10-CM | POA: Diagnosis not present

## 2021-02-11 ENCOUNTER — Other Ambulatory Visit: Payer: Self-pay

## 2021-02-11 ENCOUNTER — Ambulatory Visit
Admission: RE | Admit: 2021-02-11 | Discharge: 2021-02-11 | Disposition: A | Discharge status: Home / Self Care | Payer: No Typology Code available for payment source | Source: Ambulatory Visit | Attending: Internal Medicine | Admitting: Internal Medicine

## 2021-02-11 DIAGNOSIS — Z1231 Encounter for screening mammogram for malignant neoplasm of breast: Secondary | ICD-10-CM

## 2021-02-26 DIAGNOSIS — Z86010 Personal history of colonic polyps: Secondary | ICD-10-CM | POA: Diagnosis not present

## 2021-02-26 DIAGNOSIS — R198 Other specified symptoms and signs involving the digestive system and abdomen: Secondary | ICD-10-CM | POA: Diagnosis not present

## 2021-02-26 DIAGNOSIS — K5730 Diverticulosis of large intestine without perforation or abscess without bleeding: Secondary | ICD-10-CM | POA: Diagnosis not present

## 2021-03-20 DIAGNOSIS — Z86010 Personal history of colonic polyps: Secondary | ICD-10-CM | POA: Diagnosis not present

## 2021-03-20 DIAGNOSIS — K5730 Diverticulosis of large intestine without perforation or abscess without bleeding: Secondary | ICD-10-CM | POA: Diagnosis not present

## 2021-04-28 DIAGNOSIS — Z23 Encounter for immunization: Secondary | ICD-10-CM | POA: Diagnosis not present

## 2021-06-24 DIAGNOSIS — H35373 Puckering of macula, bilateral: Secondary | ICD-10-CM | POA: Diagnosis not present

## 2021-07-04 ENCOUNTER — Encounter (INDEPENDENT_AMBULATORY_CARE_PROVIDER_SITE_OTHER): Payer: PPO | Admitting: Ophthalmology

## 2021-07-04 ENCOUNTER — Encounter (INDEPENDENT_AMBULATORY_CARE_PROVIDER_SITE_OTHER): Payer: Self-pay

## 2021-07-14 DIAGNOSIS — Z23 Encounter for immunization: Secondary | ICD-10-CM | POA: Diagnosis not present

## 2021-07-14 DIAGNOSIS — G4700 Insomnia, unspecified: Secondary | ICD-10-CM | POA: Diagnosis not present

## 2021-07-14 DIAGNOSIS — Z1389 Encounter for screening for other disorder: Secondary | ICD-10-CM | POA: Diagnosis not present

## 2021-07-14 DIAGNOSIS — Z0000 Encounter for general adult medical examination without abnormal findings: Secondary | ICD-10-CM | POA: Diagnosis not present

## 2021-07-14 DIAGNOSIS — H6123 Impacted cerumen, bilateral: Secondary | ICD-10-CM | POA: Diagnosis not present

## 2021-07-14 DIAGNOSIS — E7800 Pure hypercholesterolemia, unspecified: Secondary | ICD-10-CM | POA: Diagnosis not present

## 2021-07-14 DIAGNOSIS — L659 Nonscarring hair loss, unspecified: Secondary | ICD-10-CM | POA: Diagnosis not present

## 2021-07-18 DIAGNOSIS — H35343 Macular cyst, hole, or pseudohole, bilateral: Secondary | ICD-10-CM | POA: Diagnosis not present

## 2021-07-18 DIAGNOSIS — H15831 Staphyloma posticum, right eye: Secondary | ICD-10-CM | POA: Diagnosis not present

## 2021-07-18 DIAGNOSIS — H35373 Puckering of macula, bilateral: Secondary | ICD-10-CM | POA: Diagnosis not present

## 2021-07-18 DIAGNOSIS — H43811 Vitreous degeneration, right eye: Secondary | ICD-10-CM | POA: Diagnosis not present

## 2021-07-25 DIAGNOSIS — H6123 Impacted cerumen, bilateral: Secondary | ICD-10-CM | POA: Diagnosis not present

## 2021-07-30 DIAGNOSIS — H6121 Impacted cerumen, right ear: Secondary | ICD-10-CM | POA: Diagnosis not present

## 2021-08-05 DIAGNOSIS — H35373 Puckering of macula, bilateral: Secondary | ICD-10-CM | POA: Diagnosis not present

## 2021-08-05 DIAGNOSIS — H25043 Posterior subcapsular polar age-related cataract, bilateral: Secondary | ICD-10-CM | POA: Diagnosis not present

## 2021-08-05 DIAGNOSIS — H2513 Age-related nuclear cataract, bilateral: Secondary | ICD-10-CM | POA: Diagnosis not present

## 2021-08-05 DIAGNOSIS — H25013 Cortical age-related cataract, bilateral: Secondary | ICD-10-CM | POA: Diagnosis not present

## 2021-08-05 DIAGNOSIS — H2511 Age-related nuclear cataract, right eye: Secondary | ICD-10-CM | POA: Diagnosis not present

## 2021-09-04 DIAGNOSIS — H35342 Macular cyst, hole, or pseudohole, left eye: Secondary | ICD-10-CM | POA: Diagnosis not present

## 2021-09-04 DIAGNOSIS — H35341 Macular cyst, hole, or pseudohole, right eye: Secondary | ICD-10-CM | POA: Diagnosis not present

## 2021-09-15 DIAGNOSIS — H35341 Macular cyst, hole, or pseudohole, right eye: Secondary | ICD-10-CM | POA: Diagnosis not present

## 2021-09-15 DIAGNOSIS — H35371 Puckering of macula, right eye: Secondary | ICD-10-CM | POA: Diagnosis not present

## 2021-09-15 DIAGNOSIS — H2511 Age-related nuclear cataract, right eye: Secondary | ICD-10-CM | POA: Diagnosis not present

## 2021-09-16 DIAGNOSIS — H35341 Macular cyst, hole, or pseudohole, right eye: Secondary | ICD-10-CM | POA: Diagnosis not present

## 2021-09-23 DIAGNOSIS — H35342 Macular cyst, hole, or pseudohole, left eye: Secondary | ICD-10-CM | POA: Diagnosis not present

## 2021-09-23 DIAGNOSIS — H35341 Macular cyst, hole, or pseudohole, right eye: Secondary | ICD-10-CM | POA: Diagnosis not present

## 2021-09-23 DIAGNOSIS — H35371 Puckering of macula, right eye: Secondary | ICD-10-CM | POA: Diagnosis not present

## 2021-09-30 DIAGNOSIS — H35342 Macular cyst, hole, or pseudohole, left eye: Secondary | ICD-10-CM | POA: Diagnosis not present

## 2021-09-30 DIAGNOSIS — H35372 Puckering of macula, left eye: Secondary | ICD-10-CM | POA: Diagnosis not present

## 2021-09-30 DIAGNOSIS — H3581 Retinal edema: Secondary | ICD-10-CM | POA: Diagnosis not present

## 2021-10-29 DIAGNOSIS — H3581 Retinal edema: Secondary | ICD-10-CM | POA: Diagnosis not present

## 2021-10-29 DIAGNOSIS — H35371 Puckering of macula, right eye: Secondary | ICD-10-CM | POA: Diagnosis not present

## 2021-11-13 DIAGNOSIS — H26491 Other secondary cataract, right eye: Secondary | ICD-10-CM | POA: Diagnosis not present

## 2022-01-14 DIAGNOSIS — H35372 Puckering of macula, left eye: Secondary | ICD-10-CM | POA: Diagnosis not present

## 2022-01-14 DIAGNOSIS — H2512 Age-related nuclear cataract, left eye: Secondary | ICD-10-CM | POA: Diagnosis not present

## 2022-01-14 DIAGNOSIS — H3581 Retinal edema: Secondary | ICD-10-CM | POA: Diagnosis not present

## 2022-01-14 DIAGNOSIS — H15831 Staphyloma posticum, right eye: Secondary | ICD-10-CM | POA: Diagnosis not present

## 2022-03-16 ENCOUNTER — Other Ambulatory Visit: Payer: Self-pay | Admitting: Internal Medicine

## 2022-03-16 DIAGNOSIS — Z1231 Encounter for screening mammogram for malignant neoplasm of breast: Secondary | ICD-10-CM

## 2022-04-03 ENCOUNTER — Ambulatory Visit: Payer: PPO

## 2022-04-28 DIAGNOSIS — Z23 Encounter for immunization: Secondary | ICD-10-CM | POA: Diagnosis not present

## 2022-05-05 ENCOUNTER — Ambulatory Visit: Payer: PPO

## 2022-05-05 ENCOUNTER — Ambulatory Visit
Admission: RE | Admit: 2022-05-05 | Discharge: 2022-05-05 | Disposition: A | Discharge status: Home / Self Care | Payer: PPO | Source: Ambulatory Visit | Attending: Internal Medicine | Admitting: Internal Medicine

## 2022-05-05 DIAGNOSIS — Z1231 Encounter for screening mammogram for malignant neoplasm of breast: Secondary | ICD-10-CM | POA: Diagnosis not present

## 2022-06-05 DIAGNOSIS — M7741 Metatarsalgia, right foot: Secondary | ICD-10-CM | POA: Diagnosis not present

## 2022-06-05 DIAGNOSIS — T8484XA Pain due to internal orthopedic prosthetic devices, implants and grafts, initial encounter: Secondary | ICD-10-CM | POA: Diagnosis not present

## 2022-06-05 DIAGNOSIS — M79671 Pain in right foot: Secondary | ICD-10-CM | POA: Diagnosis not present

## 2022-07-15 DIAGNOSIS — Z0000 Encounter for general adult medical examination without abnormal findings: Secondary | ICD-10-CM | POA: Diagnosis not present

## 2022-07-15 DIAGNOSIS — R6884 Jaw pain: Secondary | ICD-10-CM | POA: Diagnosis not present

## 2022-07-15 DIAGNOSIS — F419 Anxiety disorder, unspecified: Secondary | ICD-10-CM | POA: Diagnosis not present

## 2022-07-15 DIAGNOSIS — G4700 Insomnia, unspecified: Secondary | ICD-10-CM | POA: Diagnosis not present

## 2022-07-15 DIAGNOSIS — M8580 Other specified disorders of bone density and structure, unspecified site: Secondary | ICD-10-CM | POA: Diagnosis not present

## 2022-07-15 DIAGNOSIS — Z1389 Encounter for screening for other disorder: Secondary | ICD-10-CM | POA: Diagnosis not present

## 2022-07-15 DIAGNOSIS — E7800 Pure hypercholesterolemia, unspecified: Secondary | ICD-10-CM | POA: Diagnosis not present

## 2022-08-06 DIAGNOSIS — L658 Other specified nonscarring hair loss: Secondary | ICD-10-CM | POA: Diagnosis not present

## 2022-10-23 DIAGNOSIS — D225 Melanocytic nevi of trunk: Secondary | ICD-10-CM | POA: Diagnosis not present

## 2022-10-23 DIAGNOSIS — L821 Other seborrheic keratosis: Secondary | ICD-10-CM | POA: Diagnosis not present

## 2022-10-23 DIAGNOSIS — L814 Other melanin hyperpigmentation: Secondary | ICD-10-CM | POA: Diagnosis not present

## 2022-10-23 DIAGNOSIS — D485 Neoplasm of uncertain behavior of skin: Secondary | ICD-10-CM | POA: Diagnosis not present

## 2022-10-23 DIAGNOSIS — L7211 Pilar cyst: Secondary | ICD-10-CM | POA: Diagnosis not present

## 2023-04-05 ENCOUNTER — Other Ambulatory Visit: Payer: Self-pay | Admitting: Internal Medicine

## 2023-04-05 DIAGNOSIS — Z1231 Encounter for screening mammogram for malignant neoplasm of breast: Secondary | ICD-10-CM

## 2023-05-07 ENCOUNTER — Ambulatory Visit: Payer: PPO

## 2023-05-25 ENCOUNTER — Ambulatory Visit
Admission: RE | Admit: 2023-05-25 | Discharge: 2023-05-25 | Disposition: A | Discharge status: Home / Self Care | Payer: HMO | Source: Ambulatory Visit | Attending: Internal Medicine | Admitting: Internal Medicine

## 2023-05-25 DIAGNOSIS — Z1231 Encounter for screening mammogram for malignant neoplasm of breast: Secondary | ICD-10-CM | POA: Diagnosis not present

## 2023-06-10 DIAGNOSIS — L658 Other specified nonscarring hair loss: Secondary | ICD-10-CM | POA: Diagnosis not present

## 2023-06-11 DIAGNOSIS — H35372 Puckering of macula, left eye: Secondary | ICD-10-CM | POA: Diagnosis not present

## 2023-06-11 DIAGNOSIS — H26491 Other secondary cataract, right eye: Secondary | ICD-10-CM | POA: Diagnosis not present

## 2023-06-11 DIAGNOSIS — H35342 Macular cyst, hole, or pseudohole, left eye: Secondary | ICD-10-CM | POA: Diagnosis not present

## 2023-06-11 DIAGNOSIS — H3581 Retinal edema: Secondary | ICD-10-CM | POA: Diagnosis not present

## 2023-06-11 DIAGNOSIS — H2512 Age-related nuclear cataract, left eye: Secondary | ICD-10-CM | POA: Diagnosis not present

## 2023-07-28 ENCOUNTER — Encounter: Payer: Self-pay | Admitting: Radiology

## 2023-07-28 ENCOUNTER — Ambulatory Visit (INDEPENDENT_AMBULATORY_CARE_PROVIDER_SITE_OTHER): Payer: PPO | Admitting: Radiology

## 2023-07-28 VITALS — BP 122/70 | HR 84 | Ht 62.5 in | Wt 129.0 lb

## 2023-07-28 DIAGNOSIS — Z9189 Other specified personal risk factors, not elsewhere classified: Secondary | ICD-10-CM

## 2023-07-28 DIAGNOSIS — Z01419 Encounter for gynecological examination (general) (routine) without abnormal findings: Secondary | ICD-10-CM

## 2023-07-28 DIAGNOSIS — M8580 Other specified disorders of bone density and structure, unspecified site: Secondary | ICD-10-CM

## 2023-07-28 DIAGNOSIS — Z780 Asymptomatic menopausal state: Secondary | ICD-10-CM

## 2023-07-28 NOTE — Progress Notes (Signed)
   April Wood 1955-12-01 [id number]   History:  68 y.o. presents for annual exam. S/p TAH for fibroids 1999. Has bilateral ovaries. CIN 1 1994, normal paps all normal. Paps d/c'd 2012. Up to date on mammogram. Labs with PCP. Regular exercise. No gyn concerns.   Gynecologic History Hysterectomy  Sexually active: no  Health Maintenance Last Pap: 2012. Results were: normal Last mammogram: 2024. Results were: normal Last colonoscopy: 2017.  Last Dexa: 2022. Results were: osteopenia HRT use:no  Past medical history, past surgical history, family history and social history were all reviewed and documented in the EPIC chart.  ROS:  A ROS was performed and pertinent positives and negatives are included.  Exam:  There were no vitals filed for this visit. There is no height or weight on file to calculate BMI.  General appearance:  Normal Thyroid:  Symmetrical, normal in size, without palpable masses or nodularity. Respiratory  Auscultation:  Clear without wheezing or rhonchi Cardiovascular  Auscultation:  Regular rate, without rubs, murmurs or gallops  Edema/varicosities:  Not grossly evident Abdominal  Soft,nontender, without masses, guarding or rebound.  Liver/spleen:  No organomegaly noted  Hernia:  None appreciated  Skin  Inspection:  Grossly normal Breasts: Examined lying and sitting.   Right: Without masses, retractions, nipple discharge or axillary adenopathy.   Left: Without masses, retractions, nipple discharge or axillary adenopathy. Genitourinary   Inguinal/mons:  Normal without inguinal adenopathy  External genitalia:  Normal appearing vulva with no masses, tenderness, or lesions  BUS/Urethra/Skene's glands:  Normal  Vagina:  Normal appearing with normal color and discharge, no lesions. Atrophy moderate  Cervix:  absent  Uterus:  absent  Adnexa/parametria:     Rt: Normal in size, without masses or tenderness.   Lt: Normal in size, without masses or  tenderness.  Anus and perineum: Normal   Raynelle Fanning, CMA present for exam  Assessment/Plan:   1. Encounter for breast and pelvic examination (Primary)  2. Osteopenia after menopause - DG Bone Density; Future    Discussed SBE, colonoscopy and DEXA screening as appropriate. Encouraged 124mins/week of cardiovascular and weight bearing exercise minimum. Recommend the use of seatbelts and sunscreen consistently.   Return in 2 years for breast and pelvic or sooner prn.  Arlie Solomons B WHNP-BC 3:42 PM 07/28/2023

## 2023-10-18 DIAGNOSIS — K146 Glossodynia: Secondary | ICD-10-CM | POA: Diagnosis not present

## 2023-10-18 DIAGNOSIS — R519 Headache, unspecified: Secondary | ICD-10-CM | POA: Diagnosis not present

## 2023-10-20 DIAGNOSIS — M2022 Hallux rigidus, left foot: Secondary | ICD-10-CM | POA: Diagnosis not present

## 2023-10-20 DIAGNOSIS — M79672 Pain in left foot: Secondary | ICD-10-CM | POA: Diagnosis not present

## 2023-10-20 DIAGNOSIS — M21612 Bunion of left foot: Secondary | ICD-10-CM | POA: Diagnosis not present

## 2023-10-20 DIAGNOSIS — M7741 Metatarsalgia, right foot: Secondary | ICD-10-CM | POA: Diagnosis not present

## 2023-10-28 DIAGNOSIS — D1801 Hemangioma of skin and subcutaneous tissue: Secondary | ICD-10-CM | POA: Diagnosis not present

## 2023-10-28 DIAGNOSIS — D225 Melanocytic nevi of trunk: Secondary | ICD-10-CM | POA: Diagnosis not present

## 2023-10-28 DIAGNOSIS — L814 Other melanin hyperpigmentation: Secondary | ICD-10-CM | POA: Diagnosis not present

## 2023-10-28 DIAGNOSIS — L821 Other seborrheic keratosis: Secondary | ICD-10-CM | POA: Diagnosis not present

## 2023-10-28 DIAGNOSIS — D692 Other nonthrombocytopenic purpura: Secondary | ICD-10-CM | POA: Diagnosis not present

## 2023-11-02 DIAGNOSIS — K146 Glossodynia: Secondary | ICD-10-CM | POA: Diagnosis not present

## 2023-11-02 DIAGNOSIS — R519 Headache, unspecified: Secondary | ICD-10-CM | POA: Diagnosis not present

## 2023-11-15 DIAGNOSIS — G5603 Carpal tunnel syndrome, bilateral upper limbs: Secondary | ICD-10-CM | POA: Diagnosis not present

## 2023-11-15 DIAGNOSIS — M180 Bilateral primary osteoarthritis of first carpometacarpal joints: Secondary | ICD-10-CM | POA: Diagnosis not present

## 2023-11-15 DIAGNOSIS — M65332 Trigger finger, left middle finger: Secondary | ICD-10-CM | POA: Diagnosis not present

## 2023-11-15 DIAGNOSIS — M79642 Pain in left hand: Secondary | ICD-10-CM | POA: Diagnosis not present

## 2023-11-15 DIAGNOSIS — M79641 Pain in right hand: Secondary | ICD-10-CM | POA: Diagnosis not present

## 2023-11-15 DIAGNOSIS — M65331 Trigger finger, right middle finger: Secondary | ICD-10-CM | POA: Diagnosis not present

## 2023-11-17 DIAGNOSIS — M21612 Bunion of left foot: Secondary | ICD-10-CM | POA: Diagnosis not present

## 2023-11-25 DIAGNOSIS — K146 Glossodynia: Secondary | ICD-10-CM | POA: Diagnosis not present

## 2023-11-25 DIAGNOSIS — R519 Headache, unspecified: Secondary | ICD-10-CM | POA: Diagnosis not present

## 2024-02-08 DIAGNOSIS — G5602 Carpal tunnel syndrome, left upper limb: Secondary | ICD-10-CM | POA: Diagnosis not present

## 2024-02-08 DIAGNOSIS — G5601 Carpal tunnel syndrome, right upper limb: Secondary | ICD-10-CM | POA: Diagnosis not present

## 2024-02-22 DIAGNOSIS — M25631 Stiffness of right wrist, not elsewhere classified: Secondary | ICD-10-CM | POA: Diagnosis not present

## 2024-02-22 DIAGNOSIS — M25641 Stiffness of right hand, not elsewhere classified: Secondary | ICD-10-CM | POA: Diagnosis not present

## 2024-02-28 DIAGNOSIS — M25641 Stiffness of right hand, not elsewhere classified: Secondary | ICD-10-CM | POA: Diagnosis not present

## 2024-03-08 DIAGNOSIS — M25641 Stiffness of right hand, not elsewhere classified: Secondary | ICD-10-CM | POA: Diagnosis not present

## 2024-03-15 DIAGNOSIS — M25641 Stiffness of right hand, not elsewhere classified: Secondary | ICD-10-CM | POA: Diagnosis not present

## 2024-03-22 DIAGNOSIS — M25641 Stiffness of right hand, not elsewhere classified: Secondary | ICD-10-CM | POA: Diagnosis not present

## 2024-04-05 DIAGNOSIS — M25641 Stiffness of right hand, not elsewhere classified: Secondary | ICD-10-CM | POA: Diagnosis not present

## 2024-04-05 DIAGNOSIS — M25631 Stiffness of right wrist, not elsewhere classified: Secondary | ICD-10-CM | POA: Diagnosis not present

## 2024-04-18 ENCOUNTER — Telehealth: Payer: Self-pay | Admitting: *Deleted

## 2024-04-18 DIAGNOSIS — M8580 Other specified disorders of bone density and structure, unspecified site: Secondary | ICD-10-CM

## 2024-04-18 NOTE — Telephone Encounter (Signed)
 Patient left message on triage line, requesting return call to discuss ordering BMD at [facility] [place] location.   Call returned to patient, Left message to call [facility] Triage at [phone], option 4. To further discuss imaging locations.

## 2024-04-24 NOTE — Telephone Encounter (Signed)
 Left message to call [facility] Triage at [phone], option 4.

## 2024-04-26 NOTE — Telephone Encounter (Signed)
 Patient left voicemail requesting return call and asked to call [phone].  Message left to return call to Triage.

## 2024-04-27 ENCOUNTER — Other Ambulatory Visit: Payer: Self-pay | Admitting: Internal Medicine

## 2024-04-27 DIAGNOSIS — Z1231 Encounter for screening mammogram for malignant neoplasm of breast: Secondary | ICD-10-CM

## 2024-05-25 ENCOUNTER — Ambulatory Visit
Admission: RE | Admit: 2024-05-25 | Discharge: 2024-05-25 | Disposition: A | Discharge status: Home / Self Care | Source: Ambulatory Visit | Attending: Internal Medicine | Admitting: Internal Medicine

## 2024-05-25 DIAGNOSIS — Z1231 Encounter for screening mammogram for malignant neoplasm of breast: Secondary | ICD-10-CM | POA: Diagnosis not present

## 2024-07-31 ENCOUNTER — Other Ambulatory Visit: Payer: Self-pay

## 2024-07-31 ENCOUNTER — Emergency Department (HOSPITAL_COMMUNITY)

## 2024-07-31 ENCOUNTER — Encounter (HOSPITAL_COMMUNITY): Payer: Self-pay

## 2024-07-31 ENCOUNTER — Emergency Department (HOSPITAL_COMMUNITY)
Admission: EM | Admit: 2024-07-31 | Discharge: 2024-08-01 | Disposition: A | Attending: Emergency Medicine | Admitting: Emergency Medicine

## 2024-07-31 DIAGNOSIS — R109 Unspecified abdominal pain: Secondary | ICD-10-CM | POA: Diagnosis present

## 2024-07-31 DIAGNOSIS — K625 Hemorrhage of anus and rectum: Secondary | ICD-10-CM | POA: Insufficient documentation

## 2024-07-31 DIAGNOSIS — E871 Hypo-osmolality and hyponatremia: Secondary | ICD-10-CM | POA: Diagnosis not present

## 2024-07-31 DIAGNOSIS — K5900 Constipation, unspecified: Secondary | ICD-10-CM | POA: Diagnosis not present

## 2024-07-31 DIAGNOSIS — Z79899 Other long term (current) drug therapy: Secondary | ICD-10-CM | POA: Insufficient documentation

## 2024-07-31 LAB — CBC WITH DIFFERENTIAL/PLATELET
Abs Immature Granulocytes: 0.01 10*3/uL (ref 0.00–0.07)
Basophils Absolute: 0.1 10*3/uL (ref 0.0–0.1)
Basophils Relative: 1 %
Eosinophils Absolute: 0 10*3/uL (ref 0.0–0.5)
Eosinophils Relative: 1 %
HCT: 36.6 % (ref 36.0–46.0)
Hemoglobin: 12.7 g/dL (ref 12.0–15.0)
Immature Granulocytes: 0 %
Lymphocytes Relative: 29 %
Lymphs Abs: 2.3 10*3/uL (ref 0.7–4.0)
MCH: 30.2 pg (ref 26.0–34.0)
MCHC: 34.7 g/dL (ref 30.0–36.0)
MCV: 87.1 fL (ref 80.0–100.0)
Monocytes Absolute: 0.6 10*3/uL (ref 0.1–1.0)
Monocytes Relative: 8 %
Neutro Abs: 5 10*3/uL (ref 1.7–7.7)
Neutrophils Relative %: 61 %
Platelets: 253 10*3/uL (ref 150–400)
RBC: 4.2 MIL/uL (ref 3.87–5.11)
RDW: 13.5 % (ref 11.5–15.5)
WBC: 8.1 10*3/uL (ref 4.0–10.5)
nRBC: 0 % (ref 0.0–0.2)

## 2024-07-31 LAB — COMPREHENSIVE METABOLIC PANEL WITH GFR
ALT: 20 U/L (ref 0–44)
AST: 30 U/L (ref 15–41)
Albumin: 4.4 g/dL (ref 3.5–5.0)
Alkaline Phosphatase: 93 U/L (ref 38–126)
Anion gap: 13 (ref 5–15)
BUN: 11 mg/dL (ref 8–23)
CO2: 23 mmol/L (ref 22–32)
Calcium: 9.4 mg/dL (ref 8.9–10.3)
Chloride: 97 mmol/L — ABNORMAL LOW (ref 98–111)
Creatinine, Ser: 0.65 mg/dL (ref 0.44–1.00)
GFR, Estimated: >60 mL/min
Glucose, Bld: 97 mg/dL (ref 70–99)
Potassium: 3.6 mmol/L (ref 3.5–5.1)
Sodium: 133 mmol/L — ABNORMAL LOW (ref 135–145)
Total Bilirubin: 0.3 mg/dL (ref 0.0–1.2)
Total Protein: 7.1 g/dL (ref 6.5–8.1)

## 2024-07-31 LAB — LIPASE, BLOOD: Lipase: 45 U/L (ref 11–51)

## 2024-07-31 LAB — POC OCCULT BLOOD, ED: Fecal Occult Bld: POSITIVE — AB

## 2024-07-31 MED ORDER — FLEET ENEMA RE ENEM
1.0000 | ENEMA | Freq: Once | RECTAL | Status: AC
  Administered 2024-07-31: 1 via RECTAL
  Filled 2024-07-31: qty 1

## 2024-07-31 NOTE — ED Provider Notes (Signed)
 " [facility] EMERGENCY DEPARTMENT AT [facility] Provider Note   CSN: [id number] Arrival date & time: 07/31/24  8094     Patient presents with: Fecal Impaction   April Wood is a 69 y.o. female.  69 year old female presents emergency department with complaint of constipation for approximately 1 week.  She reports she was having harder stools last Monday with straining and no bowel movement on Wednesday or Thursday.  She went to a GI specialist on Thursday and was diagnosed with IBS and prescribed Linzess.  She started the Linzess on Thursday and also used a Fleet enema on Thursday.  Friday Saturday and Sunday she had several small loose stools.  Today patient reports she took another Linzess and a Fleet enema and then attempted to manually disimpact herself.  She reported having some bleeding after the manual disimpaction and got concerned and wanted to come in for disimpaction and evaluation of possible injury due to the manual disimpaction.  Patient denies any blood thinners and reports she typically does not have issues with constipation.  She advises she has been very stressed lately due to buying a home but has attempted to increase her fiber and wants what she eats.  Patient reports mild abdominal pain.  Denies any nausea, vomiting, fevers, dizziness, shortness of breath, chest pain.     Prior to Admission medications  Medication Sig Start Date End Date Taking? Authorizing Provider  atorvastatin (LIPITOR) 20 MG tablet Take 10 mg by mouth daily. Reported on 06/21/2015 05/31/15   [provider]  Calcium Carbonate-Vitamin D (CALCIUM + D PO) Take 1 tablet by mouth daily.    [provider]  finasteride (PROSCAR) 5 MG tablet Take by mouth.    [provider]  ibuprofen  (ADVIL ,MOTRIN ) 800 MG tablet Take 1 tablet (800 mg total) by mouth every 8 (eight) hours as needed for pain. Patient not taking: Reported on 07/28/2023 12/01/12   Rockney Evalene SQUIBB, MD   minoxidil (LONITEN) 2.5 MG tablet 1 tablet Orally once a day 08/06/22   [provider]  pregabalin (LYRICA) 25 MG capsule Take 25 mg by mouth 2 (two) times daily. 07/19/23   [provider]  zolpidem (AMBIEN) 5 MG tablet Take 5 mg by mouth at bedtime as needed.    [provider]    Allergies: Patient has no known allergies.    Review of Systems  Gastrointestinal:  Positive for constipation and rectal pain.  All other systems reviewed and are negative.   Updated Vital Signs BP (!) 144/93   Pulse 68   Temp 98 F (36.7 C) (Oral)   Resp 16   SpO2 100%   Physical Exam Vitals and nursing note reviewed.  Constitutional:      General: She is not in acute distress.    Appearance: Normal appearance. She is not ill-appearing or toxic-appearing.  HENT:     Head: Normocephalic and atraumatic.     Nose: Nose normal.  Eyes:     Extraocular Movements: Extraocular movements intact.     Conjunctiva/sclera: Conjunctivae normal.     Pupils: Pupils are equal, round, and reactive to light.  Cardiovascular:     Rate and Rhythm: Normal rate.  Pulmonary:     Effort: Pulmonary effort is normal. No respiratory distress.     Breath sounds: Normal breath sounds.  Abdominal:     General: Abdomen is flat. There is no distension.     Palpations: Abdomen is soft.  Tenderness: There is no abdominal tenderness. There is no guarding.     Comments: Abdomen is soft without distention.  No increased pain to palpation throughout.  Genitourinary:    Comments: Nurse present during entire rectal exam and disimpaction.  No active bleeding noted.  Normal rectal tone.  1 external hemorrhoid nonthrombosed.  No significant pain noted during rectal exam. Musculoskeletal:        General: Normal range of motion.     Cervical back: Normal range of motion.  Skin:    General: Skin is warm.     Capillary Refill: Capillary refill takes less than 2 seconds.  Neurological:     General: No  focal deficit present.     Mental Status: She is alert.  Psychiatric:        Mood and Affect: Mood normal.        Behavior: Behavior normal.     (all labs ordered are listed, but only abnormal results are displayed) Labs Reviewed  COMPREHENSIVE METABOLIC PANEL WITH GFR - Abnormal; Notable for the following components:      Result Value   Sodium 133 (*)    Chloride 97 (*)    All other components within normal limits  POC OCCULT BLOOD, ED - Abnormal; Notable for the following components:   Fecal Occult Bld POSITIVE (*)    All other components within normal limits  CBC WITH DIFFERENTIAL/PLATELET  LIPASE, BLOOD  POC OCCULT BLOOD, ED    EKG: None  Radiology: DG Abdomen 1 View Result Date: 07/31/2024 EXAM: 1 VIEW XRAY OF THE ABDOMEN 07/31/2024 08:59:00 PM COMPARISON: None available. CLINICAL HISTORY: Constipation. FINDINGS: BOWEL: Nonobstructive bowel gas pattern. Gas in nondilated loops of small and large bowel. Moderate colonic stool. Significant stool in the rectum. SOFT TISSUES: No abnormal calcifications. BONES: No acute fracture. IMPRESSION: 1. Moderate colonic stool with significant rectal stool burden, consistent with constipation. Electronically signed by: Morgane Naveau MD 07/31/2024 09:08 PM EST RP Workstation: [id number]     .Fecal disimpaction  Date/Time: 07/31/2024 9:52 PM  Performed by: Myriam Fonda RAMAN, PA-C Authorized by: Myriam Fonda RAMAN, PA-C  Consent: Verbal consent obtained Risks and benefits: risks, benefits and alternatives were discussed Consent given by: patient Patient understanding: patient states understanding of the procedure being performed Patient consent: the patient's understanding of the procedure matches consent given Procedure consent: procedure consent matches procedure scheduled Relevant documents: relevant documents present and verified Test results: test results available and properly labeled Site marked: the operative site was  marked Imaging studies: imaging studies available Required items: required blood products, implants, devices, and special equipment available Patient identity confirmed: verbally with patient Time out: Immediately prior to procedure a time out was called to verify the correct patient, procedure, equipment, support staff and site/side marked as required. Preparation: Patient was prepped and draped in the usual sterile fashion. Local anesthesia used: no  Anesthesia: Local anesthesia used: no  Sedation: Patient sedated: no  Patient tolerance: patient tolerated the procedure well with no immediate complications Comments: No stool was noted in the rectal vault for disimpaction.      Medications Ordered in the ED  sodium phosphate (FLEET) enema 1 enema (1 enema Rectal Given 07/31/24 2219)    69 y.o. female presents to the ED with complaints of constipation,  The differential diagnosis includes constipation, bowel obstruction, rectal bleeding, appendicitis, cholecystitis, pancreatitis.  (Ddx)  On arrival pt is nontoxic, vitals unremarkable.  Abdominal exam is unremarkable for any distention or remarkable tenderness to palpation.  Lab Tests:  CBC CMP lipase occult blood. CBC is unremarkable hemoglobin stable.  CMP notable for mild hyponatremia 133.  Lipase unremarkable.  PSA occult blood was positive with no obvious active bleeding in the rectal vault.  Imaging Studies ordered:  I ordered imaging studies which included abdominal x-ray.  Which noted moderate stool with significant rectal stool burden   ED Course:   Patient is sitting comfortably in ED bed in no acute distress nontoxic-appearing on initial evaluation.  Patient reports she is impacted and has attempted to disimpact herself which caused some rectal bleeding and pain.  Patient has already been seen by GI and diagnosed with IBS and given Linzess.  Will obtain KUB with unremarkable abdominal exam.  Abdominal lab work will  be ordered and evaluation of hemoglobin.  Patient was attempted to be disimpacted but no stool was felt in the rectal vault.  Patient will be started on Fleet enemas and soapsuds if the enema fails.  No active bleeding or lacerations were noted in the rectal vault.  Patient did have positive occult blood.  Patient care transferred to [facility] pending bowel movement after enema and possibly soapsuds enema.   Portions of this note were generated with Scientist, clinical (histocompatibility and immunogenetics). Dictation errors may occur despite best attempts at proofreading.   Final diagnoses:  None    ED Discharge Orders     None          Myriam Fonda GORMAN DEVONNA 07/31/24 2231    Lenor Hollering, MD 07/31/24 2242  "

## 2024-07-31 NOTE — ED Triage Notes (Signed)
 Pt reports with fecal impaction x 2 days. Pt used a fleet enema and tried to disimpact herself with no relief.

## 2024-07-31 NOTE — ED Provider Notes (Signed)
" °  Physical Exam  BP 104/87   Pulse 82   Temp 97.7 F (36.5 C)   Resp 17   SpO2 97%   Physical Exam  Procedures  Procedures  ED Course / MDM    Medical Decision Making Amount and/or Complexity of Data Reviewed Labs: ordered. Radiology: ordered.  Risk OTC drugs.   Patient care signed out at shift handoff.  See previous providers notes for full details.  In short, patient with a few days of constipation.  Patient believes she may have a fecal impaction but was unable to disimpact results excessively at home.  Previous provider attempted disimpaction but felt no stool ball in the rectal vault.  Patient was treated with an enema.  At the time of shift handoff plan to evaluate for possible bowel movement, if no bowel movement plan to use soapsuds enema  Patient did have a bowel movement.  She states she feels like she is cleaned out at this time.  No indication for soapsuds enema at this time.  Patient will be started on bowel regimen at home and will follow-up with gastroenterology and primary care for further evaluation.  Patient voices understanding of plan and is comfortable with discharge at this time.  Disorder       April Wood 08/01/24 0002  "

## 2024-07-31 NOTE — ED Notes (Signed)
 Pt was able to pass solid stool after enema

## 2024-08-01 MED ORDER — SENNOSIDES-DOCUSATE SODIUM 8.6-50 MG PO TABS: 1.0000 | ORAL_TABLET | Freq: Every day | ORAL | 0 refills | Status: AC

## 2024-08-01 NOTE — Discharge Instructions (Signed)
 You were evaluated this evening for constipation.  You had a bowel movement after an enema was performed.  I have prescribed a medication called Senokot-S which is a combination laxative stool softener.  Please take as directed.  Follow-up with primary care and gastroenterology for further evaluation.  Return to the emergency department if you develop any life-threatening symptoms.

## 2024-08-05 ENCOUNTER — Emergency Department (HOSPITAL_COMMUNITY)
Admission: EM | Admit: 2024-08-05 | Discharge: 2024-08-05 | Disposition: A | Discharge status: Home / Self Care | Attending: Emergency Medicine | Admitting: Emergency Medicine

## 2024-08-05 ENCOUNTER — Other Ambulatory Visit: Payer: Self-pay

## 2024-08-05 ENCOUNTER — Emergency Department (HOSPITAL_COMMUNITY)

## 2024-08-05 DIAGNOSIS — K5641 Fecal impaction: Secondary | ICD-10-CM | POA: Diagnosis not present

## 2024-08-05 DIAGNOSIS — R1030 Lower abdominal pain, unspecified: Secondary | ICD-10-CM | POA: Diagnosis present

## 2024-08-05 LAB — LIPASE, BLOOD: Lipase: 47 U/L (ref 11–51)

## 2024-08-05 LAB — CBC WITH DIFFERENTIAL/PLATELET
Abs Immature Granulocytes: 0.01 10*3/uL (ref 0.00–0.07)
Basophils Absolute: 0 10*3/uL (ref 0.0–0.1)
Basophils Relative: 1 %
Eosinophils Absolute: 0 10*3/uL (ref 0.0–0.5)
Eosinophils Relative: 0 %
HCT: 37 % (ref 36.0–46.0)
Hemoglobin: 12.6 g/dL (ref 12.0–15.0)
Immature Granulocytes: 0 %
Lymphocytes Relative: 28 %
Lymphs Abs: 2 10*3/uL (ref 0.7–4.0)
MCH: 30.2 pg (ref 26.0–34.0)
MCHC: 34.1 g/dL (ref 30.0–36.0)
MCV: 88.7 fL (ref 80.0–100.0)
Monocytes Absolute: 0.6 10*3/uL (ref 0.1–1.0)
Monocytes Relative: 9 %
Neutro Abs: 4.5 10*3/uL (ref 1.7–7.7)
Neutrophils Relative %: 62 %
Platelets: 242 10*3/uL (ref 150–400)
RBC: 4.17 MIL/uL (ref 3.87–5.11)
RDW: 13.6 % (ref 11.5–15.5)
WBC: 7.1 10*3/uL (ref 4.0–10.5)
nRBC: 0 % (ref 0.0–0.2)

## 2024-08-05 LAB — COMPREHENSIVE METABOLIC PANEL WITH GFR
ALT: 19 U/L (ref 0–44)
AST: 27 U/L (ref 15–41)
Albumin: 4.1 g/dL (ref 3.5–5.0)
Alkaline Phosphatase: 89 U/L (ref 38–126)
Anion gap: 9 (ref 5–15)
BUN: 7 mg/dL — ABNORMAL LOW (ref 8–23)
CO2: 26 mmol/L (ref 22–32)
Calcium: 9.2 mg/dL (ref 8.9–10.3)
Chloride: 100 mmol/L (ref 98–111)
Creatinine, Ser: 0.78 mg/dL (ref 0.44–1.00)
GFR, Estimated: >60 mL/min
Glucose, Bld: 93 mg/dL (ref 70–99)
Potassium: 4.1 mmol/L (ref 3.5–5.1)
Sodium: 135 mmol/L (ref 135–145)
Total Bilirubin: 0.3 mg/dL (ref 0.0–1.2)
Total Protein: 6.7 g/dL (ref 6.5–8.1)

## 2024-08-05 LAB — URINALYSIS, ROUTINE W REFLEX MICROSCOPIC
Bilirubin Urine: NEGATIVE
Glucose, UA: NEGATIVE mg/dL
Hgb urine dipstick: NEGATIVE
Ketones, ur: NEGATIVE mg/dL
Leukocytes,Ua: NEGATIVE
Nitrite: NEGATIVE
Protein, ur: NEGATIVE mg/dL
Specific Gravity, Urine: 1.002 — ABNORMAL LOW (ref 1.005–1.030)
pH: 8 (ref 5.0–8.0)

## 2024-08-05 LAB — POC OCCULT BLOOD, ED: Fecal Occult Bld: NEGATIVE

## 2024-08-05 MED ORDER — IOHEXOL 300 MG/ML  SOLN
100.0000 mL | Freq: Once | INTRAMUSCULAR | Status: AC | PRN
  Administered 2024-08-05: 85 mL via INTRAVENOUS

## 2024-08-05 MED ORDER — FLEET ENEMA RE ENEM
1.0000 | ENEMA | Freq: Once | RECTAL | Status: AC
  Administered 2024-08-05: 1 via RECTAL
  Filled 2024-08-05: qty 1

## 2024-08-05 MED ORDER — FLEET ENEMA RE ENEM: 1.0000 | ENEMA | Freq: Every day | RECTAL | 0 refills | Status: AC | PRN

## 2024-08-05 MED ORDER — LACTATED RINGERS IV BOLUS
500.0000 mL | Freq: Once | INTRAVENOUS | Status: AC
  Administered 2024-08-05: 500 mL via INTRAVENOUS

## 2024-08-05 MED ORDER — POLYETHYLENE GLYCOL 3350 17 G PO PACK: 17.0000 g | PACK | Freq: Every day | ORAL | 0 refills | Status: AC

## 2024-08-05 NOTE — ED Provider Notes (Signed)
 " April Wood EMERGENCY DEPARTMENT AT [facility] Provider Note   CSN: [id number] Arrival date & time: 08/05/24  1520     Patient presents with: Fecal Impaction   April Wood is a 69 y.o. female. HPI Patient is a 69 year old female presenting ED today for concerns for infrequent, hard bowel movements x 7 days accompanied with lower abdominal pain and rectal pain, seen previously in the emergency department for similar symptoms.  Reported that she has taken extensive amount of laxatives since then, taking magnesium citrate, MiraLAX , Senokot, home remedies with high-fiber at home with minimal success.  Note that she has had 1 small, hard stool daily.  Additionally noting that she has recently began to develop some urinary urgency, reporting some difficulty urinating which she believes is secondary to constipation, also reporting some lower abdominal bloating.   Notes that she has been trying to follow-up with her GI specialist but the earliest she can see them is 1 month from now.  Previous medical history of Diverticulosis, anxiety, GERD, HLD, osteopenia.  Denies fever, headache, vision changes, chest pain, shortness of breath, nausea, vomiting, diarrhea, melena, hematochezia, dysuria, hematuria, rashes, lower leg swelling.    Prior to Admission medications  Medication Sig Start Date End Date Taking? Authorizing Provider  polyethylene glycol (MIRALAX ) 17 g packet Take 17 g by mouth daily. 08/05/24  Yes Deano Tomaszewski S, PA-C  sodium phosphate (FLEET) ENEM Place 133 mLs (1 enema total) rectally daily as needed for severe constipation. 08/05/24  Yes Beola Terrall GORMAN, PA-C  atorvastatin (LIPITOR) 20 MG tablet Take 10 mg by mouth daily. Reported on 06/21/2015 05/31/15   [provider]  Calcium Carbonate-Vitamin D (CALCIUM + D PO) Take 1 tablet by mouth daily.    [provider]  finasteride (PROSCAR) 5 MG tablet Take by mouth.    [provider]  ibuprofen   (ADVIL ,MOTRIN ) 800 MG tablet Take 1 tablet (800 mg total) by mouth every 8 (eight) hours as needed for pain. Patient not taking: Reported on 07/28/2023 12/01/12   Rockney Evalene SQUIBB, MD  minoxidil (LONITEN) 2.5 MG tablet 1 tablet Orally once a day 08/06/22   [provider]  pregabalin (LYRICA) 25 MG capsule Take 25 mg by mouth 2 (two) times daily. 07/19/23   [provider]  senna-docusate (SENOKOT-S) 8.6-50 MG tablet Take 1 tablet by mouth daily for 20 days. 08/01/24 08/21/24  Logan Ubaldo NOVAK, PA-C  zolpidem (AMBIEN) 5 MG tablet Take 5 mg by mouth at bedtime as needed.    [provider]    Allergies: Patient has no known allergies.    Review of Systems  Updated Vital Signs BP 133/89   Pulse 74   Temp 97.7 F (36.5 C) (Oral)   Resp 18   SpO2 100%   Physical Exam Vitals and nursing note reviewed.  Constitutional:      General: She is not in acute distress.    Appearance: Normal appearance. She is not ill-appearing or diaphoretic.  HENT:     Head: Normocephalic and atraumatic.  Eyes:     General: No scleral icterus.       Right eye: No discharge.        Left eye: No discharge.     Extraocular Movements: Extraocular movements intact.     Conjunctiva/sclera: Conjunctivae normal.  Cardiovascular:     Rate and Rhythm: Normal rate and regular rhythm.     Pulses: Normal pulses.     Heart sounds: Normal heart  sounds. No murmur heard.    No friction rub. No gallop.  Pulmonary:     Effort: Pulmonary effort is normal. No respiratory distress.     Breath sounds: No stridor. No wheezing, rhonchi or rales.  Chest:     Chest wall: No tenderness.  Abdominal:     General: Abdomen is flat.     Palpations: Abdomen is soft.     Tenderness: There is abdominal tenderness (Notable does have mild lower abdominal tenderness to palpation). There is no right CVA tenderness, left CVA tenderness, guarding or rebound.  Musculoskeletal:        General: No swelling, deformity  or signs of injury.     Cervical back: Normal range of motion. No rigidity.     Right lower leg: No edema.     Left lower leg: No edema.  Skin:    General: Skin is warm and dry.     Findings: No bruising, erythema or lesion.  Neurological:     General: No focal deficit present.     Mental Status: She is alert and oriented to person, place, and time. Mental status is at baseline.     Sensory: No sensory deficit.     Motor: No weakness.  Psychiatric:        Mood and Affect: Mood normal.     (all labs ordered are listed, but only abnormal results are displayed) Labs Reviewed  COMPREHENSIVE METABOLIC PANEL WITH GFR - Abnormal; Notable for the following components:      Result Value   BUN 7 (*)    All other components within normal limits  URINALYSIS, ROUTINE W REFLEX MICROSCOPIC - Abnormal; Notable for the following components:   Color, Urine COLORLESS (*)    Specific Gravity, Urine 1.002 (*)    All other components within normal limits  CBC WITH DIFFERENTIAL/PLATELET  LIPASE, BLOOD  POC OCCULT BLOOD, ED    EKG: EKG Interpretation Date/Time:  Saturday August 05 2024 17:11:07 EST Ventricular Rate:  64 PR Interval:  122 QRS Duration:  116 QT Interval:  445 QTC Calculation: 460 R Axis:   -74  Text Interpretation: Sinus rhythm Incomplete RBBB and LAFB Low voltage, precordial leads Confirmed by Dasie Faden ([id number]) on 08/05/2024 7:46:03 PM  Radiology: CT ABDOMEN PELVIS W CONTRAST Result Date: 08/05/2024 CLINICAL DATA:  Left lower quadrant abdominal pain, constipation for 7 days, urinary urgency EXAM: CT ABDOMEN AND PELVIS WITH CONTRAST TECHNIQUE: Multidetector CT imaging of the abdomen and pelvis was performed using the standard protocol following bolus administration of intravenous contrast. RADIATION DOSE REDUCTION: This exam was performed according to the departmental dose-optimization program which includes automated exposure control, adjustment of the mA and/or kV  according to patient size and/or use of iterative reconstruction technique. CONTRAST:  85mL OMNIPAQUE  IOHEXOL  300 MG/ML  SOLN COMPARISON:  07/31/2024 FINDINGS: Lower chest: No acute pleural or parenchymal lung disease. Hepatobiliary: No focal liver abnormality is seen. No gallstones, gallbladder wall thickening, or biliary dilatation. Pancreas: Unremarkable. No pancreatic ductal dilatation or surrounding inflammatory changes. Spleen: Normal in size without focal abnormality. Adrenals/Urinary Tract: The kidneys enhance normally and symmetrically. No urinary tract calculi or obstructive uropathy. Bladder is moderately distended without filling defect. The adrenals are unremarkable. Stomach/Bowel: No bowel obstruction or ileus. There is a large amount of retained stool within the rectal vault which may reflect fecal impaction. Diffuse colonic diverticulosis without evidence of acute diverticulitis. No bowel wall thickening or inflammatory change. Vascular/Lymphatic: No significant vascular findings are present. No enlarged  abdominal or pelvic lymph nodes. Reproductive: Status post hysterectomy. No adnexal masses. Other: No free fluid or free intraperitoneal gas. No abdominal wall hernia. Musculoskeletal: No acute or destructive bony abnormalities. Reconstructed images demonstrate no additional findings. IMPRESSION: 1. Large amount of retained stool within the rectal vault, consistent with fecal impaction. 2. No bowel obstruction or ileus. 3. Diffuse colonic diverticulosis without diverticulitis. Electronically Signed   By: Ozell Daring M.D.   On: 08/05/2024 18:24    .Fecal disimpaction  Date/Time: 08/05/2024 8:30 PM  Performed by: Beola Terrall RAMAN, PA-C Authorized by: Beola Terrall RAMAN, PA-C  Consent: Verbal consent obtained Risks and benefits: risks, benefits and alternatives were discussed Consent given by: patient Patient understanding: patient states understanding of the procedure being performed Test  results: test results available and properly labeled Site marked: the operative site was marked Imaging studies: imaging studies available Patient identity confirmed: verbally with patient and arm band Time out: Immediately prior to procedure a time out was called to verify the correct patient, procedure, equipment, support staff and site/side marked as required. Preparation: Patient was prepped and draped in the usual sterile fashion. Local anesthesia used: no  Anesthesia: Local anesthesia used: no  Sedation: Patient sedated: no  Patient tolerance: patient tolerated the procedure well with no immediate complications      Medications Ordered in the ED  lactated ringers  bolus 500 mL (0 mLs Intravenous Stopping previously hung infusion 08/05/24 1931)  iohexol  (OMNIPAQUE ) 300 MG/ML solution 100 mL (85 mLs Intravenous Contrast Given 08/05/24 1807)  sodium phosphate (FLEET) enema 1 enema (1 enema Rectal Given 08/05/24 1954)   Medical Decision Making  This patient is a 69 year old female who presents to the ED for concern of infrequent, hard stools x 7 days with concern for fecal impaction.  Noting mild abdominal pain to lower abdomen.  Notes that she is been taking since amount of laxatives without success.  Seen previously for same in the emergency department.  Requiring enema.  On physical exam, patient is in no acute distress, afebrile, alert and orient x 4, speaking in full sentences, nontachypneic, nontachycardic.  Notable does have some lower abdominal tenderness to palpation.  No distention noted.  No CVA tenderness.  Patient overall well-appearing.  CT scan was done due to patient's pain and reported constipation.  Which showed fecal impaction.  Lab work was unremarkable.  Fecal disimpaction was then done.  And large amount of stool was removed.  Additional Fleet enema was done and patient had successful bowel movement with extensive stool.  Will have her continue to follow-up with  her GI doctor which she already has a scheduled appointment for in a month, sending home with the enemas and additionally having her take MiraLAX .  Patient notes improved symptoms.  Patient vital signs have remained stable throughout the course of patient's time in the ED. Low suspicion for any other emergent pathology at this time. I believe this patient is safe to be discharged. Provided strict return to ER precautions. Patient expressed agreement and understanding of plan. All questions were answered.  Differential diagnoses prior to evaluation: The emergent differential diagnosis includes, but is not limited to, diverticulitis, SBO, LBO, gastroenteritis, stercoral colitis, cholecystitis, pancreatitis, malignancy. This is not an exhaustive differential.   Past Medical History / Co-morbidities / Social History: Diverticulosis, anxiety, GERD, HLD, osteopenia  Additional history: Chart reviewed. Pertinent results include:   Last saw PCP on 08/02/2023 noting to have cerumen impaction treated at that time.  Last seen in emergency  department on 08/01/2023 for constipation, concern for fecal impaction at that time.  Previous evaluator attempted disimpaction but felt no stool ball.  Additionally treated with enema, with enema being successful and patient reporting bowel movement and relief of symptoms.  Prescribed Senokot.  Lab Tests/Imaging studies: I personally interpreted labs/imaging and the pertinent results include:    CBC unremarkable UA no has decreased specific gravity likely secondary to overhydration. CMP unremarkable Lipase unremarkable  CT scan shows fecal impaction without any other acute findings.  I agree with the radiologist interpretation.  Cardiac monitoring: EKG obtained and interpreted by myself and attending physician which shows: Sinus rhythm with incomplete RBBB   Medications: I ordered medication including MiraLAX , Fleet enema.  I have reviewed the patients home  medicines and have made adjustments as needed.  Critical Interventions: None  Social Determinants of Health: Has good follow-up with GI  Disposition: After consideration of the diagnostic results and the patients response to treatment, I feel that the patient would benefit from discharge and turn as above.   emergency department workup does not suggest an emergent condition requiring admission or immediate intervention beyond what has been performed at this time. The plan is: Follow-up with GI, Fleet enemas and MiraLAX , return to the ED for new or worsening symptoms. The patient is safe for discharge and has been instructed to return immediately for worsening symptoms, change in symptoms or any other concerns.   Final diagnoses:  [facility]    ED Discharge Orders          Ordered    sodium phosphate (FLEET) ENEM  Daily PRN        08/05/24 2031    polyethylene glycol (MIRALAX ) 17 g packet  Daily        08/05/24 2031               Bethany Hirt S, PA-C 08/05/24 2146    Dasie Faden, MD 08/05/24 2250  "

## 2024-08-05 NOTE — Discharge Instructions (Addendum)
 You were seen today for fecal impaction. A disimpaction and enema were done today and were successful.  CT scan noted fecal impaction but otherwise was reassuring.  Lab work also reassuring.  Recommending continue to follow-up with your GI doctor for your already scheduled appointment as well as using Fleet enemas and doses of recurrence of your constipation.  Recommending you start using Metamucil as well as have sent in some MiraLAx  daily.  Return to the ER if you have any uncontrolled pain, blood in urine or stool, shortness of breath, nausea or vomiting

## 2024-08-05 NOTE — ED Triage Notes (Signed)
 Pt has c/o fecal impaction that she was recently seen for. Pt has been taking all prescribed medications and still unable to have a real BM.

## 2024-11-02 ENCOUNTER — Ambulatory Visit (HOSPITAL_BASED_OUTPATIENT_CLINIC_OR_DEPARTMENT_OTHER)
